# Patient Record
Sex: Female | Born: 1995
Health system: Southern US, Community
[De-identification: ages and names within clinical notes are randomized; demographics above are authoritative.]

## PROBLEM LIST (undated history)

## (undated) DIAGNOSIS — Z789 Other specified health status: Secondary | ICD-10-CM

## (undated) DIAGNOSIS — M419 Scoliosis, unspecified: Secondary | ICD-10-CM

## (undated) DIAGNOSIS — K219 Gastro-esophageal reflux disease without esophagitis: Secondary | ICD-10-CM

## (undated) HISTORY — PX: NO PAST SURGERIES: SHX2092

---
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
## 2017-01-17 ENCOUNTER — Encounter (HOSPITAL_COMMUNITY): Payer: Self-pay

## 2017-01-17 ENCOUNTER — Emergency Department (HOSPITAL_COMMUNITY)
Admission: EM | Admit: 2017-01-17 | Discharge: 2017-01-17 | Disposition: A | Discharge status: Home / Self Care | Payer: BC Managed Care – PPO | Attending: Emergency Medicine | Admitting: Emergency Medicine

## 2017-01-17 DIAGNOSIS — R112 Nausea with vomiting, unspecified: Secondary | ICD-10-CM | POA: Diagnosis not present

## 2017-01-17 DIAGNOSIS — R197 Diarrhea, unspecified: Secondary | ICD-10-CM | POA: Insufficient documentation

## 2017-01-17 DIAGNOSIS — F17210 Nicotine dependence, cigarettes, uncomplicated: Secondary | ICD-10-CM | POA: Diagnosis not present

## 2017-01-17 LAB — CBC
HCT: 36.2 % (ref 36.0–46.0)
Hemoglobin: 11.8 g/dL — ABNORMAL LOW (ref 12.0–15.0)
MCH: 26.8 pg (ref 26.0–34.0)
MCHC: 32.6 g/dL (ref 30.0–36.0)
MCV: 82.3 fL (ref 78.0–100.0)
Platelets: 232 10*3/uL (ref 150–400)
RBC: 4.4 MIL/uL (ref 3.87–5.11)
RDW: 15.9 % — ABNORMAL HIGH (ref 11.5–15.5)
WBC: 11.4 10*3/uL — ABNORMAL HIGH (ref 4.0–10.5)

## 2017-01-17 LAB — URINALYSIS, ROUTINE W REFLEX MICROSCOPIC
Bacteria, UA: NONE SEEN
Bilirubin Urine: NEGATIVE
GLUCOSE, UA: NEGATIVE mg/dL
Ketones, ur: 20 mg/dL — AB
Leukocytes, UA: NEGATIVE
NITRITE: NEGATIVE
PH: 8 (ref 5.0–8.0)
Protein, ur: NEGATIVE mg/dL
Specific Gravity, Urine: 1.02 (ref 1.005–1.030)

## 2017-01-17 LAB — COMPREHENSIVE METABOLIC PANEL
ALK PHOS: 80 U/L (ref 38–126)
ALT: 13 U/L — ABNORMAL LOW (ref 14–54)
ANION GAP: 9 (ref 5–15)
AST: 18 U/L (ref 15–41)
Albumin: 4.4 g/dL (ref 3.5–5.0)
BILIRUBIN TOTAL: 0.7 mg/dL (ref 0.3–1.2)
BUN: 8 mg/dL (ref 6–20)
CALCIUM: 9.4 mg/dL (ref 8.9–10.3)
CO2: 19 mmol/L — ABNORMAL LOW (ref 22–32)
Chloride: 109 mmol/L (ref 101–111)
Creatinine, Ser: 0.68 mg/dL (ref 0.44–1.00)
GFR calc non Af Amer: >60 mL/min (ref >60)
GLUCOSE: 127 mg/dL — AB (ref 65–99)
Potassium: 3.5 mmol/L (ref 3.5–5.1)
Sodium: 137 mmol/L (ref 135–145)
TOTAL PROTEIN: 7.2 g/dL (ref 6.5–8.1)

## 2017-01-17 LAB — LIPASE, BLOOD: Lipase: 17 U/L (ref 11–51)

## 2017-01-17 LAB — I-STAT BETA HCG BLOOD, ED (MC, WL, AP ONLY): I-stat hCG, quantitative: <5 m[IU]/mL (ref <5)

## 2017-01-17 MED ORDER — ONDANSETRON HCL 4 MG/2ML IJ SOLN
4.0000 mg | Freq: Once | INTRAMUSCULAR | Status: AC
Start: 2017-01-17 — End: 2017-01-17
  Administered 2017-01-17: 4 mg via INTRAVENOUS
  Filled 2017-01-17: qty 2

## 2017-01-17 MED ORDER — SODIUM CHLORIDE 0.9 % IV BOLUS (SEPSIS)
1000.0000 mL | Freq: Once | INTRAVENOUS | Status: AC
  Administered 2017-01-17: 1000 mL via INTRAVENOUS

## 2017-01-17 MED ORDER — ONDANSETRON 4 MG PO TBDP
4.0000 mg | ORAL_TABLET | Freq: Once | ORAL | Status: AC | PRN
  Administered 2017-01-17: 4 mg via ORAL

## 2017-01-17 MED ORDER — ONDANSETRON 4 MG PO TBDP
ORAL_TABLET | ORAL | Status: AC
  Filled 2017-01-17: qty 1

## 2017-01-17 MED ORDER — ONDANSETRON 4 MG PO TBDP: 4.0000 mg | ORAL_TABLET | Freq: Three times a day (TID) | ORAL | 0 refills | Status: DC | PRN

## 2017-01-17 NOTE — ED Provider Notes (Signed)
[facility]-EMERGENCY DEPT Provider Note   CSN: [id number] Arrival date & time: 01/17/17  1348     History   Chief Complaint Chief Complaint  Patient presents with  . Emesis  . Diarrhea  . Abdominal Pain    HPI Jodi Rogers is a 21 y.o. female who presents today with complaint of nausea and vomiting, with associated diarrhea. Her symptoms began when she awoke at around 4am this morning to find she had started her period, and states she has vomited around 10 times today. She has been unable to keep food down and endorses 2/10 crampy suprapubic pain which she attributes to her period. At this point, emesis is bilious with no blood. She endorses chills, dizziness, and some episodes of loose stool today.   Denies fever, CP/SOB, Ha, dysuria, hematuria, vaginal itching/discharge, hematochezia.   The history is provided by the patient.    Past Medical History:  Diagnosis Date  . Medical history non-contributory   . Scoliosis     Patient Active Problem List   Diagnosis Date Noted  . Hematemesis with nausea   . Intractable nausea and vomiting 08/11/2016    Past Surgical History:  Procedure Laterality Date  . ESOPHAGOGASTRODUODENOSCOPY N/A 08/13/2016   Procedure: ESOPHAGOGASTRODUODENOSCOPY (EGD);  Surgeon: Scot Jun, MD;  Location: [facility] ENDOSCOPY;  Service: Endoscopy;  Laterality: N/A;  . NO PAST SURGERIES      OB History    No data available       Home Medications    Prior to Admission medications   Medication Sig Start Date End Date Taking? Authorizing Provider  famotidine (PEPCID) 20 MG tablet Take 1 tablet (20 mg total) by mouth 2 (two) times daily. 08/13/16   Katha Hamming, MD  ondansetron (ZOFRAN ODT) 4 MG disintegrating tablet Take 1 tablet (4 mg total) by mouth every 8 (eight) hours as needed for nausea or vomiting. 08/07/16   Darci Current, MD  sucralfate (CARAFATE) 1 g tablet Take 1 tablet (1 g total) by mouth 4 (four) times daily -  with meals and at  bedtime. 08/13/16   Katha Hamming, MD    Family History Family History  Problem Relation Age of Onset  . Diabetes Neg Hx     Social History Social History  Substance Use Topics  . Smoking status: Current Every Day Smoker    Packs/day: 0.50    Types: Cigarettes  . Smokeless tobacco: Never Used  . Alcohol use No     Allergies   Patient has no known allergies.   Review of Systems Review of Systems  Constitutional: Positive for chills. Negative for fever.  HENT: Negative for congestion and sore throat.   Eyes: Negative for discharge and itching.  Respiratory: Negative for shortness of breath.   Cardiovascular: Negative for chest pain.  Gastrointestinal: Positive for abdominal pain, diarrhea, nausea and vomiting. Negative for abdominal distention and blood in stool.  Endocrine: Positive for cold intolerance.  Genitourinary: Negative for dysuria, hematuria and pelvic pain.  Musculoskeletal: Negative for back pain.  Neurological: Positive for dizziness. Negative for syncope and headaches.  Hematological: Negative for adenopathy.     Physical Exam Updated Vital Signs BP 121/84   Pulse 78   Temp 98.7 F (37.1 C) (Oral)   Resp 16   Ht 5\' 10"  (1.778 m)   Wt 68 kg   LMP 01/16/2017   SpO2 98%   BMI 21.52 kg/m   Physical Exam  Constitutional: She is oriented to person, place, and time.  She appears well-developed and well-nourished. No distress.  HENT:  Head: Normocephalic and atraumatic.  Eyes: Conjunctivae are normal. Right eye exhibits no discharge. Left eye exhibits no discharge. No scleral icterus.  Neck: Neck supple. No JVD present. No tracheal deviation present.  Cardiovascular: Normal rate, regular rhythm, normal heart sounds and intact distal pulses.   Pulmonary/Chest: Effort normal and breath sounds normal.  Abdominal: Soft. Bowel sounds are normal. She exhibits no distension and no mass. There is no tenderness. There is no rebound and no guarding.    Negative murphy's, rovsing's, no TTP at McBurney's point.  Musculoskeletal: Normal range of motion.  Lymphadenopathy:    She has no cervical adenopathy.  Neurological: She is alert and oriented to person, place, and time.  Skin: Skin is warm and dry. Capillary refill takes 2 to 3 seconds. She is not diaphoretic. There is pallor.  No skin tenting  Psychiatric: She has a normal mood and affect. Her behavior is normal. Judgment and thought content normal.     ED Treatments / Results  Labs (all labs ordered are listed, but only abnormal results are displayed) Labs Reviewed  COMPREHENSIVE METABOLIC PANEL - Abnormal; Notable for the following:       Result Value   CO2 19 (*)    Glucose, Bld 127 (*)    ALT 13 (*)    All other components within normal limits  CBC - Abnormal; Notable for the following:    WBC 11.4 (*)    Hemoglobin 11.8 (*)    RDW 15.9 (*)    All other components within normal limits  URINALYSIS, ROUTINE W REFLEX MICROSCOPIC - Abnormal; Notable for the following:    Hgb urine dipstick SMALL (*)    Ketones, ur 20 (*)    Squamous Epithelial / LPF 0-5 (*)    All other components within normal limits  LIPASE, BLOOD  I-STAT BETA HCG BLOOD, ED ([facility], [facility], [facility] ONLY)    EKG  EKG Interpretation None       Radiology No results found.  Procedures Procedures (including critical care time)  Medications Ordered in ED Medications  ondansetron (ZOFRAN-ODT) 4 MG disintegrating tablet (not administered)  sodium chloride 0.9 % bolus 1,000 mL (not administered)  ondansetron (ZOFRAN) injection 4 mg (not administered)  ondansetron (ZOFRAN-ODT) disintegrating tablet 4 mg (4 mg Oral Given 01/17/17 1417)     Initial Impression / Assessment and Plan / ED Course  I have reviewed the triage vital signs and the nursing notes.  Pertinent labs & imaging results that were available during my care of the patient were reviewed by me and considered in my medical decision making (see  chart for details).     12yof presents to ED with chief complaint of N/V/D since 4am today. Afebrile, VSS. Labwork shows mild nonspecific WBC elevation and decreased bicarb, consistent with persistent vomiting. Do not suspect appendicitis or acute abdomen due to vitals and unimpressive abdominal exam with no distention, tenderness, guarding or rebound. Negative pregnancy test so do not suspect ectopic pregnancy, unlikely ovarian torsion/cyst and symptoms not consistent with STI/PID. Pt given 1L NS bolus as well as IV zofran, followed by PO challenge, which she tolerated well. Symptomatology possibly related to viral enteritis; advised pt to hydrate and restrict diet to tolerable foods. Will send home with PO antiemetic and advised to return to ED if any concerning symptoms such as fever, severe abdominal pain, or heavy bleeding develop. Pt will f/u with PCP in 1 week for re-evaluation.  Final Clinical Impressions(s) / ED Diagnoses   Final diagnoses:  None    New Prescriptions New Prescriptions   No medications on file     Jeanie SewerMina A Sumi Lye, [place]PA 01/17/17 2115    Jacalyn LefevreJulie Haviland, MD 01/17/17 2140

## 2017-01-17 NOTE — ED Triage Notes (Signed)
[facility].  Onset last night cramping, started period. Woke up 4am vomiting x 7, last vomit at doctors office.  Diarrhea x 2-3, loose.  Last urinated at pediatric office, mouth moist.

## 2017-01-19 ENCOUNTER — Encounter (HOSPITAL_COMMUNITY): Payer: Self-pay | Admitting: *Deleted

## 2017-01-19 ENCOUNTER — Inpatient Hospital Stay (HOSPITAL_COMMUNITY)
Admission: AD | Admit: 2017-01-19 | Discharge: 2017-01-19 | Disposition: A | Discharge status: Home / Self Care | Payer: BC Managed Care – PPO | Source: Ambulatory Visit | Attending: Obstetrics and Gynecology | Admitting: Obstetrics and Gynecology

## 2017-01-19 DIAGNOSIS — E860 Dehydration: Secondary | ICD-10-CM | POA: Diagnosis not present

## 2017-01-19 DIAGNOSIS — F17210 Nicotine dependence, cigarettes, uncomplicated: Secondary | ICD-10-CM | POA: Insufficient documentation

## 2017-01-19 DIAGNOSIS — E876 Hypokalemia: Secondary | ICD-10-CM | POA: Insufficient documentation

## 2017-01-19 DIAGNOSIS — R1110 Vomiting, unspecified: Secondary | ICD-10-CM | POA: Diagnosis present

## 2017-01-19 DIAGNOSIS — A084 Viral intestinal infection, unspecified: Secondary | ICD-10-CM | POA: Insufficient documentation

## 2017-01-19 DIAGNOSIS — Z3202 Encounter for pregnancy test, result negative: Secondary | ICD-10-CM | POA: Insufficient documentation

## 2017-01-19 LAB — URINALYSIS, ROUTINE W REFLEX MICROSCOPIC
Bilirubin Urine: NEGATIVE
Glucose, UA: NEGATIVE mg/dL
Ketones, ur: 80 mg/dL — AB
LEUKOCYTES UA: NEGATIVE
Nitrite: NEGATIVE
PROTEIN: 30 mg/dL — AB
Specific Gravity, Urine: 1.028 (ref 1.005–1.030)
pH: 8 (ref 5.0–8.0)

## 2017-01-19 LAB — CBC
HEMATOCRIT: 34.7 % — AB (ref 36.0–46.0)
Hemoglobin: 11.6 g/dL — ABNORMAL LOW (ref 12.0–15.0)
MCH: 27.3 pg (ref 26.0–34.0)
MCHC: 33.4 g/dL (ref 30.0–36.0)
MCV: 81.6 fL (ref 78.0–100.0)
PLATELETS: 218 10*3/uL (ref 150–400)
RBC: 4.25 MIL/uL (ref 3.87–5.11)
RDW: 16.4 % — AB (ref 11.5–15.5)
WBC: 8.7 10*3/uL (ref 4.0–10.5)

## 2017-01-19 LAB — COMPREHENSIVE METABOLIC PANEL
ALBUMIN: 4.4 g/dL (ref 3.5–5.0)
ALT: 14 U/L (ref 14–54)
ANION GAP: 7 (ref 5–15)
AST: 18 U/L (ref 15–41)
Alkaline Phosphatase: 78 U/L (ref 38–126)
BILIRUBIN TOTAL: 0.7 mg/dL (ref 0.3–1.2)
BUN: 19 mg/dL (ref 6–20)
CHLORIDE: 108 mmol/L (ref 101–111)
CO2: 22 mmol/L (ref 22–32)
Calcium: 8.8 mg/dL — ABNORMAL LOW (ref 8.9–10.3)
Creatinine, Ser: 0.69 mg/dL (ref 0.44–1.00)
GFR calc Af Amer: >60 mL/min (ref >60)
GFR calc non Af Amer: >60 mL/min (ref >60)
GLUCOSE: 110 mg/dL — AB (ref 65–99)
POTASSIUM: 2.8 mmol/L — AB (ref 3.5–5.1)
SODIUM: 137 mmol/L (ref 135–145)
TOTAL PROTEIN: 7.5 g/dL (ref 6.5–8.1)

## 2017-01-19 LAB — POCT PREGNANCY, URINE: PREG TEST UR: NEGATIVE

## 2017-01-19 LAB — LIPASE, BLOOD: Lipase: 12 U/L (ref 11–51)

## 2017-01-19 MED ORDER — LACTATED RINGERS IV BOLUS (SEPSIS)
1000.0000 mL | Freq: Once | INTRAVENOUS | Status: AC
Start: 2017-01-19 — End: 2017-01-19
  Administered 2017-01-19: 1000 mL via INTRAVENOUS

## 2017-01-19 MED ORDER — PROMETHAZINE HCL 25 MG RE SUPP: 25.0000 mg | Freq: Four times a day (QID) | RECTAL | 0 refills | Status: DC | PRN

## 2017-01-19 MED ORDER — PROMETHAZINE HCL 25 MG PO TABS: 25.0000 mg | ORAL_TABLET | Freq: Four times a day (QID) | ORAL | 0 refills | Status: DC | PRN

## 2017-01-19 MED ORDER — POTASSIUM CHLORIDE 20 MEQ PO PACK: 40.0000 meq | PACK | Freq: Every day | ORAL | 0 refills | Status: DC

## 2017-01-19 MED ORDER — PROMETHAZINE HCL 25 MG/ML IJ SOLN
25.0000 mg | Freq: Once | INTRAMUSCULAR | Status: AC
  Administered 2017-01-19: 25 mg via INTRAVENOUS
  Filled 2017-01-19: qty 1

## 2017-01-19 NOTE — MAU Provider Note (Signed)
History     CSN: [id number]  Arrival date and time: 01/19/17 1059   First Provider Initiated Contact with Patient 01/19/17 1125      Chief Complaint  Patient presents with  . Emesis  . Abdominal Pain   Non-pregnant female here with vomiting since this am. She reports 5 episodes of vomiting since 0600. No diarrhea. No sick contacts. Reports tolerating some water today. She used Marijuana last night and states that helped. She was seen in [facility] 2 days ago for similar sx and sent home with Zofran which she took this morning but had no relief. She reports feeling somewhat better yesterday but then became nauseated last night after eating a heavy meal of pasta and alfredo sauce. She reports some allover abdominal discomfort. LMP 01/16/17.     Past Medical History:  Diagnosis Date  . Medical history non-contributory   . Scoliosis     Past Surgical History:  Procedure Laterality Date  . ESOPHAGOGASTRODUODENOSCOPY N/A 08/13/2016   Procedure: ESOPHAGOGASTRODUODENOSCOPY (EGD);  Surgeon: Scot Jun, MD;  Location: [facility] ENDOSCOPY;  Service: Endoscopy;  Laterality: N/A;  . NO PAST SURGERIES      Family History  Problem Relation Age of Onset  . Diabetes Neg Hx     Social History  Substance Use Topics  . Smoking status: Current Every Day Smoker    Packs/day: 0.50    Types: Cigarettes  . Smokeless tobacco: Never Used  . Alcohol use No    Allergies: No Known Allergies  Prescriptions Prior to Admission  Medication Sig Dispense Refill Last Dose  . ondansetron (ZOFRAN ODT) 4 MG disintegrating tablet Take 1 tablet (4 mg total) by mouth every 8 (eight) hours as needed for nausea or vomiting. 9 tablet 0 01/19/2017 at 0830  . famotidine (PEPCID) 20 MG tablet Take 1 tablet (20 mg total) by mouth 2 (two) times daily. (Patient not taking: Reported on 01/19/2017) 60 tablet 0 Not Taking at Unknown time    Review of Systems  Constitutional: Negative for fever.  Gastrointestinal: Positive for  abdominal pain (discomfort).   Physical Exam   Blood pressure 127/80, pulse (!) 52, temperature 98.2 F (36.8 C), temperature source Oral, resp. rate 18, weight 68.9 kg (152 lb), last menstrual period 01/16/2017.  Physical Exam  Nursing note and vitals reviewed. Constitutional: She is oriented to person, place, and time. She appears well-developed and well-nourished. She has a sickly appearance. No distress.  HENT:  Head: Normocephalic and atraumatic.  Neck: Normal range of motion. Neck supple.  Respiratory: Effort normal.  GI: Soft. She exhibits no distension and no mass. There is no tenderness. There is no rebound and no guarding.  Musculoskeletal: Normal range of motion.  Neurological: She is alert and oriented to person, place, and time.  Skin: Skin is warm and dry.  Psychiatric: She has a normal mood and affect.   Results for orders placed or performed during the hospital encounter of 01/19/17 (from the past 24 hour(s))  Urinalysis, Routine w reflex microscopic     Status: Abnormal   Collection Time: 01/19/17 11:03 AM  Result Value Ref Range   Color, Urine YELLOW YELLOW   APPearance HAZY (A) CLEAR   Specific Gravity, Urine 1.028 1.005 - 1.030   pH 8.0 5.0 - 8.0   Glucose, UA NEGATIVE NEGATIVE mg/dL   Hgb urine dipstick LARGE (A) NEGATIVE   Bilirubin Urine NEGATIVE NEGATIVE   Ketones, ur 80 (A) NEGATIVE mg/dL   Protein, ur 30 (A) NEGATIVE mg/dL  Nitrite NEGATIVE NEGATIVE   Leukocytes, UA NEGATIVE NEGATIVE   RBC / HPF TOO NUMEROUS TO COUNT 0 - 5 RBC/hpf   WBC, UA 0-5 0 - 5 WBC/hpf   Bacteria, UA RARE (A) NONE SEEN   Squamous Epithelial / LPF 0-5 (A) NONE SEEN   Mucous PRESENT   Pregnancy, urine POC     Status: None   Collection Time: 01/19/17 11:38 AM  Result Value Ref Range   Preg Test, Ur NEGATIVE NEGATIVE  CBC     Status: Abnormal   Collection Time: 01/19/17 12:29 PM  Result Value Ref Range   WBC 8.7 4.0 - 10.5 K/uL   RBC 4.25 3.87 - 5.11 MIL/uL   Hemoglobin  11.6 (L) 12.0 - 15.0 g/dL   HCT 16.1 (L) 09.6 - 04.5 %   MCV 81.6 78.0 - 100.0 fL   MCH 27.3 26.0 - 34.0 pg   MCHC 33.4 30.0 - 36.0 g/dL   RDW 40.9 (H) 81.1 - 91.4 %   Platelets 218 150 - 400 K/uL  Comprehensive metabolic panel     Status: Abnormal   Collection Time: 01/19/17 12:29 PM  Result Value Ref Range   Sodium 137 135 - 145 mmol/L   Potassium 2.8 (L) 3.5 - 5.1 mmol/L   Chloride 108 101 - 111 mmol/L   CO2 22 22 - 32 mmol/L   Glucose, Bld 110 (H) 65 - 99 mg/dL   BUN 19 6 - 20 mg/dL   Creatinine, Ser 7.82 0.44 - 1.00 mg/dL   Calcium 8.8 (L) 8.9 - 10.3 mg/dL   Total Protein 7.5 6.5 - 8.1 g/dL   Albumin 4.4 3.5 - 5.0 g/dL   AST 18 15 - 41 U/L   ALT 14 14 - 54 U/L   Alkaline Phosphatase 78 38 - 126 U/L   Total Bilirubin 0.7 0.3 - 1.2 mg/dL   GFR calc non Af Amer >60 >60 mL/min   GFR calc Af Amer >60 >60 mL/min   Anion gap 7 5 - 15  Lipase, blood     Status: None   Collection Time: 01/19/17 12:29 PM  Result Value Ref Range   Lipase 12 11 - 51 U/L    MAU Course  Procedures LR 1 L  Phenergan 25 mg IV  MDM Labs ordered and reviewed. No evidence of acute abdominal process. VSS & AF. Nausea improved. Tolerating po fluid. Will treat Hypokalemia. Stable for discharge home.   Assessment and Plan   1. Viral gastroenteritis   2. Dehydration   3. Hypokalemia    Discharge home Follow up with PCP if sx persist BRAT diet Maintain hydration Rx Phenergan tab and supp Rx K-lor Return to MAU for OBGYN emergencies  Allergies as of 01/19/2017   No Known Allergies     Medication List    STOP taking these medications   famotidine 20 MG tablet Commonly known as:  PEPCID   ondansetron 4 MG disintegrating tablet Commonly known as:  ZOFRAN ODT     TAKE these medications   potassium chloride 20 MEQ packet Commonly known as:  KLOR-CON Take 40 mEq by mouth daily.   promethazine 25 MG tablet Commonly known as:  PHENERGAN Take 1 tablet (25 mg total) by mouth every 6 (six)  hours as needed for nausea or vomiting.   promethazine 25 MG suppository Commonly known as:  PHENERGAN Place 1 suppository (25 mg total) rectally every 6 (six) hours as needed for nausea.      Shawna Orleans  Denyse AmassBhambri, CNM 01/19/2017, 3:15 PM

## 2017-01-19 NOTE — Discharge Instructions (Signed)
Bland Diet A bland diet consists of foods that do not have a lot of fat or fiber. Foods without fat or fiber are easier for the body to digest. They are also less likely to irritate your mouth, throat, stomach, and other parts of your gastrointestinal tract. A bland diet is sometimes called a BRAT diet. What is my plan? Your health care provider or dietitian may recommend specific changes to your diet to prevent and treat your symptoms, such as:  Eating small meals often.  Cooking food until it is soft enough to chew easily.  Chewing your food well.  Drinking fluids slowly.  Not eating foods that are very spicy, sour, or fatty.  Not eating citrus fruits, such as oranges and grapefruit. What do I need to know about this diet?  Eat a variety of foods from the bland diet food list.  Do not follow a bland diet longer than you have to.  Ask your health care provider whether you should take vitamins. What foods can I eat? Grains   Hot cereals, such as cream of wheat. Bread, crackers, or tortillas made from refined white flour. Rice. Vegetables  Canned or cooked vegetables. Mashed or boiled potatoes. Fruits  Bananas. Applesauce. Other types of cooked or canned fruit with the skin and seeds removed, such as canned peaches or pears. Meats and Other Protein Sources  Scrambled eggs. Creamy peanut butter or other nut butters. Lean, well-cooked meats, such as chicken or fish. Tofu. Soups or broths. Dairy  Low-fat dairy products, such as milk, cottage cheese, or yogurt. Beverages  Water. Herbal tea. Apple juice. Sweets and Desserts  Pudding. Custard. Fruit gelatin. Ice cream. Fats and Oils  Mild salad dressings. Canola or olive oil. The items listed above may not be a complete list of allowed foods or beverages. Contact your dietitian for more options.  What foods are not recommended? Foods and ingredients that are often not recommended include:  Spicy foods, such as hot sauce or  salsa.  Fried foods.  Sour foods, such as pickled or fermented foods.  Raw vegetables or fruits, especially citrus or berries.  Caffeinated drinks.  Alcohol.  Strongly flavored seasonings or condiments. The items listed above may not be a complete list of foods and beverages that are not allowed. Contact your dietitian for more information.  This information is not intended to replace advice given to you by your health care provider. Make sure you discuss any questions you have with your health care provider. Document Released: 02/22/2016 Document Revised: 04/07/2016 Document Reviewed: 11/12/2014 Elsevier Interactive Patient Education  2017 [employer] Inc. Viral Gastroenteritis, Adult Viral gastroenteritis is also known as the stomach flu. This condition is caused by certain germs (viruses). These germs can be passed from person to person very easily (are very contagious). This condition can cause sudden watery poop (diarrhea), fever, and throwing up (vomiting). Having watery poop and throwing up can make you feel weak and cause you to get dehydrated. Dehydration can make you tired and thirsty, make you have a dry mouth, and make it so you pee (urinate) less often. Older adults and people with other diseases or a weak defense system (immune system) are at higher risk for dehydration. It is important to replace the fluids that you lose from having watery poop and throwing up. Follow these instructions at home: Follow instructions from your doctor about how to care for yourself at home. Eating and drinking   Follow these instructions as told by your doctor:  Take an oral rehydration solution (ORS). This is a drink that is sold at pharmacies and stores.  Drink clear fluids in small amounts as you are able, such as:  Water.  Ice chips.  Diluted fruit juice.  Low-calorie sports drinks.  Eat bland, easy-to-digest foods in small amounts as you are able, such  as:  Bananas.  Applesauce.  Rice.  Low-fat (lean) meats.  Toast.  Crackers.  Avoid fluids that have a lot of sugar or caffeine in them.  Avoid alcohol.  Avoid spicy or fatty foods. General instructions   Drink enough fluid to keep your pee (urine) clear or pale yellow.  Wash your hands often. If you cannot use soap and water, use hand sanitizer.  Make sure that all people in your home wash their hands well and often.  Rest at home while you get better.  Take over-the-counter and prescription medicines only as told by your doctor.  Watch your condition for any changes.  Take a warm bath to help with any burning or pain from having watery poop.  Keep all follow-up visits as told by your doctor. This is important. Contact a doctor if:  You cannot keep fluids down.  Your symptoms get worse.  You have new symptoms.  You feel light-headed or dizzy.  You have muscle cramps. Get help right away if:  You have chest pain.  You feel very weak or you pass out (faint).  You see blood in your throw-up.  Your throw-up looks like coffee grounds.  You have bloody or black poop (stools) or poop that look like tar.  You have a very bad headache, a stiff neck, or both.  You have a rash.  You have very bad pain, cramping, or bloating in your belly (abdomen).  You have trouble breathing.  You are breathing very quickly.  Your heart is beating very quickly.  Your skin feels cold and clammy.  You feel confused.  You have pain when you pee.  You have signs of dehydration, such as:  Dark pee, hardly any pee, or no pee.  Cracked lips.  Dry mouth.  Sunken eyes.  Sleepiness.  Weakness. This information is not intended to replace advice given to you by your health care provider. Make sure you discuss any questions you have with your health care provider. Document Released: 04/18/2008 Document Revised: 05/20/2016 Document Reviewed: 07/07/2015 Elsevier  Interactive Patient Education  2017 [employer].

## 2017-01-19 NOTE — MAU Note (Signed)
Pt was seen in [facility] 2 days ago for vomiting & diarrhea, received IV fluids.  Was feeling better yesterday, was able to keep down food.  Woke up nauseated this morning, has been vomiting ever since, BM's are more solid today.  Lower abd pain also this morning.

## 2017-01-19 NOTE — MAU Provider Note (Signed)
History     CSN: [id number]  Arrival date and time: 01/19/17 1059   First Provider Initiated Contact with Patient 01/19/17 1125      Chief Complaint  Patient presents with  . Emesis  . Abdominal Pain   HPI 21 y/o female with a past medical hx of gastritis presents today to MAU with vomiting and abdominal pain. The abdominal pain is mild and left sided She stated the vomiting started 2 days ago and associates with onset of period. She originally went to her pediatrician where she was told to go to the Er. She was diagnosed with a viral gastroenteritis and received fluids and zofran at the ER felt better and was d/c home with SL zofran. She reports feeling well yesterday and reported eating a large meal yesterday evening. She woke up this morning at 6am not feeling well and reports 5 episodes of vomiting and continued abd pain. Tried zofran with no relief.  Reports diarrhea over the past two days but had a normal BM last night. Does smoke tobacco and mariajuana. Last smoked mariajuana last night and states no association with the vomiting. States she was recently checked for STI.    OB History    No data available      Past Medical History:  Diagnosis Date  . Medical history non-contributory   . Scoliosis     Past Surgical History:  Procedure Laterality Date  . ESOPHAGOGASTRODUODENOSCOPY N/A 08/13/2016   Procedure: ESOPHAGOGASTRODUODENOSCOPY (EGD);  Surgeon: Scot Jun, MD;  Location: [facility] ENDOSCOPY;  Service: Endoscopy;  Laterality: N/A;  . NO PAST SURGERIES      Family History  Problem Relation Age of Onset  . Diabetes Neg Hx     Social History  Substance Use Topics  . Smoking status: Current Every Day Smoker    Packs/day: 0.50    Types: Cigarettes  . Smokeless tobacco: Never Used  . Alcohol use No    Allergies: No Known Allergies  Prescriptions Prior to Admission  Medication Sig Dispense Refill Last Dose  . ondansetron (ZOFRAN ODT) 4 MG disintegrating tablet  Take 1 tablet (4 mg total) by mouth every 8 (eight) hours as needed for nausea or vomiting. 9 tablet 0 01/19/2017 at 0830  . famotidine (PEPCID) 20 MG tablet Take 1 tablet (20 mg total) by mouth 2 (two) times daily. (Patient not taking: Reported on 01/19/2017) 60 tablet 0 Not Taking at Unknown time    Review of Systems  Constitutional: Positive for appetite change, chills and fatigue.  HENT: Positive for congestion.   Respiratory: Negative for chest tightness and shortness of breath.   Cardiovascular: Negative for chest pain.  Gastrointestinal: Positive for abdominal pain, blood in stool, diarrhea, nausea and vomiting. Negative for abdominal distention and constipation.  Genitourinary: Negative for difficulty urinating, flank pain and urgency.  Musculoskeletal: Positive for arthralgias.  Neurological: Negative for dizziness.   Physical Exam   Blood pressure 127/80, pulse (!) 52, temperature 97.5 F (36.4 C), temperature source Oral, resp. rate 18, weight 68.9 kg (152 lb), last menstrual period 01/16/2017.  Physical Exam  Constitutional: She is oriented to person, place, and time. She appears well-developed and well-nourished. She appears distressed.  HENT:  Head: Normocephalic and atraumatic.  Neck: Neck supple.  Cardiovascular: Normal rate, regular rhythm and normal heart sounds.  Exam reveals no gallop and no friction rub.   No murmur heard. Respiratory: Effort normal and breath sounds normal.  GI: Soft. Bowel sounds are normal. She exhibits no  distension and no mass. There is tenderness (LUQ). There is no rebound and no guarding.  Neurological: She is alert and oriented to person, place, and time.  Skin: Skin is warm and dry.  Psychiatric: She has a normal mood and affect.    MAU Course  Procedures  MDM Pt was seen at ED on 3/6 and diagnosed with viral gastroenteritis. Her labs showed mildly elevated WBCs and decreased bicarb consistent with vomiting. She responded well to fluids  and antiemetics. Urine was normal. Will give Phenergan IV and fluids. Re-check CBC and CMP. CBC WNL. BMP shows Hypokalemia. Will replete. Tolerated oral intake.   Assessment and Plan   1. Vomiting- Phenergan Q4 PRN may use rectal or suppository. BRAT diet.  2. Hypokalemia- Oral Kcl 7 days   Seek Emergency care if severe vomiting persists despite treatment or if you develop chest pain or heart racing.  Melburn PopperJames Avrohom Mckelvin 01/19/2017, 11:46 AM

## 2017-05-13 ENCOUNTER — Emergency Department
Admission: EM | Admit: 2017-05-13 | Discharge: 2017-05-13 | Discharge status: Against Medical Advice | Payer: BC Managed Care – PPO | Attending: Emergency Medicine | Admitting: Emergency Medicine

## 2017-05-13 ENCOUNTER — Encounter: Payer: Self-pay | Admitting: Emergency Medicine

## 2017-05-13 DIAGNOSIS — R102 Pelvic and perineal pain: Secondary | ICD-10-CM | POA: Diagnosis present

## 2017-05-13 DIAGNOSIS — Z5321 Procedure and treatment not carried out due to patient leaving prior to being seen by health care provider: Secondary | ICD-10-CM | POA: Diagnosis not present

## 2017-05-13 LAB — COMPREHENSIVE METABOLIC PANEL
ALT: 13 U/L — AB (ref 14–54)
AST: 21 U/L (ref 15–41)
Albumin: 5 g/dL (ref 3.5–5.0)
Alkaline Phosphatase: 85 U/L (ref 38–126)
Anion gap: 6 (ref 5–15)
BILIRUBIN TOTAL: 0.8 mg/dL (ref 0.3–1.2)
BUN: 14 mg/dL (ref 6–20)
CO2: 24 mmol/L (ref 22–32)
CREATININE: 0.69 mg/dL (ref 0.44–1.00)
Calcium: 9.7 mg/dL (ref 8.9–10.3)
Chloride: 107 mmol/L (ref 101–111)
GFR calc Af Amer: >60 mL/min (ref >60)
Glucose, Bld: 114 mg/dL — ABNORMAL HIGH (ref 65–99)
Potassium: 3.4 mmol/L — ABNORMAL LOW (ref 3.5–5.1)
Sodium: 137 mmol/L (ref 135–145)
TOTAL PROTEIN: 8.2 g/dL — AB (ref 6.5–8.1)

## 2017-05-13 LAB — CBC
HCT: 42 % (ref 35.0–47.0)
Hemoglobin: 13.8 g/dL (ref 12.0–16.0)
MCH: 27.8 pg (ref 26.0–34.0)
MCHC: 32.8 g/dL (ref 32.0–36.0)
MCV: 84.7 fL (ref 80.0–100.0)
PLATELETS: 244 10*3/uL (ref 150–440)
RBC: 4.95 MIL/uL (ref 3.80–5.20)
RDW: 17.6 % — AB (ref 11.5–14.5)
WBC: 9 10*3/uL (ref 3.6–11.0)

## 2017-05-13 MED ORDER — SODIUM CHLORIDE 0.9 % IV BOLUS (SEPSIS): 1000.0000 mL | Freq: Once | INTRAVENOUS | Status: DC

## 2017-05-13 MED ORDER — ONDANSETRON 4 MG PO TBDP
4.0000 mg | ORAL_TABLET | Freq: Once | ORAL | Status: AC | PRN
  Administered 2017-05-13: 4 mg via ORAL
  Filled 2017-05-13: qty 1

## 2017-05-13 NOTE — ED Notes (Signed)
Called back to subwait by visitor; pt wants to leave; spoke with pt, encouraging her to stay, but she refuses; IV pulled; pt leaving via wheelchair; encouraged pt to return for any urgent needs

## 2017-05-13 NOTE — ED Triage Notes (Signed)
Pt states is having pelvic pain, nausea, "sweating". Pt states she started menstrating this am. Pt states "i have really bad periods but this is the worst". Pt with pwd skin.

## 2018-06-21 ENCOUNTER — Emergency Department
Admission: EM | Admit: 2018-06-21 | Discharge: 2018-06-21 | Disposition: A | Discharge status: Home / Self Care | Payer: BC Managed Care – PPO | Attending: Emergency Medicine | Admitting: Emergency Medicine

## 2018-06-21 ENCOUNTER — Emergency Department: Payer: BC Managed Care – PPO

## 2018-06-21 ENCOUNTER — Other Ambulatory Visit: Payer: Self-pay

## 2018-06-21 ENCOUNTER — Encounter: Payer: Self-pay | Admitting: Emergency Medicine

## 2018-06-21 DIAGNOSIS — F1210 Cannabis abuse, uncomplicated: Secondary | ICD-10-CM | POA: Insufficient documentation

## 2018-06-21 DIAGNOSIS — Z79899 Other long term (current) drug therapy: Secondary | ICD-10-CM | POA: Diagnosis not present

## 2018-06-21 DIAGNOSIS — F17210 Nicotine dependence, cigarettes, uncomplicated: Secondary | ICD-10-CM | POA: Insufficient documentation

## 2018-06-21 DIAGNOSIS — M79644 Pain in right finger(s): Secondary | ICD-10-CM | POA: Insufficient documentation

## 2018-06-21 MED ORDER — MELOXICAM 7.5 MG PO TABS: 7.5000 mg | ORAL_TABLET | Freq: Every day | ORAL | 1 refills | Status: AC

## 2018-06-21 NOTE — ED Triage Notes (Signed)
PT to ED via POV with c/o RT hand pain after injury during summer camp. + Swelling noted.

## 2018-06-21 NOTE — ED Provider Notes (Signed)
[facility] Emergency Department Provider Note  ____________________________________________  Time seen: Approximately 7:18 PM  I have reviewed the triage vital signs and the nursing notes.   HISTORY  Chief Complaint Hand Pain    HPI Jodi Rogers is a 22 y.o. female presents to the emergency department with 8 out of 10 aching right hand pain along the distribution of the ulnar collateral ligament of the right thumb.  Patient reports that she was playing tug of war at a camp, her place of work when injury occurred.  Patient reports that her thumb was hyperextended.  Patient is now having difficulty performing flexion at the thumb.  No alleviating measures have been attempted.   Past Medical History:  Diagnosis Date  . Medical history non-contributory   . Scoliosis     Patient Active Problem List   Diagnosis Date Noted  . Hematemesis with nausea   . Intractable nausea and vomiting 08/11/2016    Past Surgical History:  Procedure Laterality Date  . ESOPHAGOGASTRODUODENOSCOPY N/A 08/13/2016   Procedure: ESOPHAGOGASTRODUODENOSCOPY (EGD);  Surgeon: Scot Jun, MD;  Location: [facility] ENDOSCOPY;  Service: Endoscopy;  Laterality: N/A;  . NO PAST SURGERIES      Prior to Admission medications   Medication Sig Start Date End Date Taking? Authorizing Provider  meloxicam (MOBIC) 7.5 MG tablet Take 1 tablet (7.5 mg total) by mouth daily for 7 days. 06/21/18 06/28/18  Orvil Feil, PA-C  potassium chloride (KLOR-CON) 20 MEQ packet Take 40 mEq by mouth daily. 01/19/17   Donette Larry, CNM  promethazine (PHENERGAN) 25 MG suppository Place 1 suppository (25 mg total) rectally every 6 (six) hours as needed for nausea. 01/19/17 01/19/18  Donette Larry, CNM  promethazine (PHENERGAN) 25 MG tablet Take 1 tablet (25 mg total) by mouth every 6 (six) hours as needed for nausea or vomiting. 01/19/17   Donette Larry, CNM    Allergies Hydrocodone  Family History  Problem  Relation Age of Onset  . Diabetes Neg Hx     Social History Social History   Tobacco Use  . Smoking status: Current Every Day Smoker    Packs/day: 0.50    Types: Cigarettes  . Smokeless tobacco: Never Used  Substance Use Topics  . Alcohol use: No  . Drug use: Yes    Types: Marijuana     Review of Systems  Constitutional: No fever/chills Eyes: No visual changes. No discharge ENT: No upper respiratory complaints. Cardiovascular: no chest pain. Respiratory: no cough. No SOB. Gastrointestinal: No abdominal pain.  No nausea, no vomiting.  No diarrhea.  No constipation. Musculoskeletal: Patient has right thumb pain.  Skin: Negative for rash, abrasions, lacerations, ecchymosis. Neurological: Negative for headaches, focal weakness or numbness.  ____________________________________________   PHYSICAL EXAM:  VITAL SIGNS: ED Triage Vitals  Enc Vitals Group     BP 06/21/18 1707 (!) 90/55     Pulse Rate 06/21/18 1707 70     Resp 06/21/18 1707 18     Temp 06/21/18 1707 98.5 F (36.9 C)     Temp Source 06/21/18 1707 Oral     SpO2 06/21/18 1707 99 %     Weight 06/21/18 1709 170 lb (77.1 kg)     Height 06/21/18 1709 5\' 10"  (1.778 m)     Head Circumference --      Peak Flow --      Pain Score 06/21/18 1712 4     Pain Loc --      Pain Edu? --  Excl. in GC? --      Constitutional: Alert and oriented. Well appearing and in no acute distress. Eyes: Conjunctivae are normal. PERRL. EOMI. Head: Atraumatic. Cardiovascular: Normal rate, regular rhythm. Normal S1 and S2.  Good peripheral circulation. Respiratory: Normal respiratory effort without tachypnea or retractions. Lungs CTAB. Good air entry to the bases with no decreased or absent breath sounds. Musculoskeletal: Patient has edema and ecchymosis along the distribution of the right ulnar collateral ligament of the thumb.  Patient is having difficulty performing extension at the thumb.  Palpable radial pulse,  right. Neurologic:  Normal speech and language. No gross focal neurologic deficits are appreciated.  Skin:  Skin is warm, dry and intact. No rash noted. Psychiatric: Mood and affect are normal. Speech and behavior are normal. Patient exhibits appropriate insight and judgement.   ____________________________________________   LABS (all labs ordered are listed, but only abnormal results are displayed)  Labs Reviewed - No data to display ____________________________________________  EKG   ____________________________________________  RADIOLOGY I personally viewed and evaluated these images as part of my medical decision making, as well as reviewing the written report by the radiologist.  Dg Hand Complete Right  Result Date: 06/21/2018 CLINICAL DATA:  Pain following fall EXAM: RIGHT HAND - COMPLETE 3+ VIEW COMPARISON:  None. FINDINGS: Frontal, oblique, and lateral views were obtained. No evident fracture or dislocation. Joint spaces appear normal. No erosive change. There is mild soft tissue swelling. IMPRESSION: No fracture or dislocation. No appreciable arthropathy. Mild soft tissue swelling. Electronically Signed   By: Bretta BangWilliam  Woodruff III M.D.   On: 06/21/2018 17:47    ____________________________________________    PROCEDURES  Procedure(s) performed:    Procedures    Medications - No data to display   ____________________________________________   INITIAL IMPRESSION / ASSESSMENT AND PLAN / ED COURSE  Pertinent labs & imaging results that were available during my care of the patient were reviewed by me and considered in my medical decision making (see chart for details).  Review of the [place] CSRS was performed in accordance of the [facility] prior to dispensing any controlled drugs.      Assessment and plan Right thumb pain Patient presents to the emergency department with right thumb pain after patient was playing tug of war at a local camp.  X-ray examination reveals  no acute abnormality.  Patient has pain, edema and ecchymosis along the distribution of the right thumb ulnar collateral ligament.  Patient was splinted in the emergency department and referred to Dr. Stephenie AcresSoria.  The importance of appropriate follow-up was emphasized during 2 instances of this emergency department encounter.  She was discharged with meloxicam.  Vital signs are reassuring prior to discharge.  All patient questions were answered.      ____________________________________________  FINAL CLINICAL IMPRESSION(S) / ED DIAGNOSES  Final diagnoses:  Pain of right thumb      NEW MEDICATIONS STARTED DURING THIS VISIT:  ED Discharge Orders         Ordered    meloxicam (MOBIC) 7.5 MG tablet  Daily     06/21/18 1856              This chart was dictated using voice recognition software/Dragon. Despite best efforts to proofread, errors can occur which can change the meaning. Any change was purely unintentional.    Gasper LloydWoods, Nefertari Rebman M, PA-C 06/21/18 2042    Phineas SemenGoodman, [place], MD 06/21/18 2255

## 2018-06-21 NOTE — ED Notes (Signed)
See triage note  Presents with pain and swelling to right hand  States she hit it this afternoon at camp  Positive swelling noted

## 2019-02-22 ENCOUNTER — Encounter (HOSPITAL_COMMUNITY): Payer: Self-pay | Admitting: Emergency Medicine

## 2019-02-22 ENCOUNTER — Emergency Department (HOSPITAL_COMMUNITY)
Admission: EM | Admit: 2019-02-22 | Discharge: 2019-02-22 | Disposition: A | Discharge status: Home / Self Care | Payer: BC Managed Care – PPO | Attending: Emergency Medicine | Admitting: Emergency Medicine

## 2019-02-22 ENCOUNTER — Other Ambulatory Visit: Payer: Self-pay

## 2019-02-22 DIAGNOSIS — E876 Hypokalemia: Secondary | ICD-10-CM

## 2019-02-22 DIAGNOSIS — E860 Dehydration: Secondary | ICD-10-CM | POA: Diagnosis not present

## 2019-02-22 DIAGNOSIS — R112 Nausea with vomiting, unspecified: Secondary | ICD-10-CM

## 2019-02-22 DIAGNOSIS — F17210 Nicotine dependence, cigarettes, uncomplicated: Secondary | ICD-10-CM | POA: Diagnosis not present

## 2019-02-22 DIAGNOSIS — R1116 Cannabis hyperemesis syndrome: Secondary | ICD-10-CM

## 2019-02-22 DIAGNOSIS — F12988 Cannabis use, unspecified with other cannabis-induced disorder: Secondary | ICD-10-CM | POA: Diagnosis not present

## 2019-02-22 LAB — LIPASE, BLOOD: Lipase: 23 U/L (ref 11–51)

## 2019-02-22 LAB — COMPREHENSIVE METABOLIC PANEL
ALT: 15 U/L (ref 0–44)
AST: 23 U/L (ref 15–41)
Albumin: 4.6 g/dL (ref 3.5–5.0)
Alkaline Phosphatase: 90 U/L (ref 38–126)
Anion gap: 16 — ABNORMAL HIGH (ref 5–15)
BUN: 18 mg/dL (ref 6–20)
CO2: 16 mmol/L — ABNORMAL LOW (ref 22–32)
Calcium: 9.6 mg/dL (ref 8.9–10.3)
Chloride: 107 mmol/L (ref 98–111)
Creatinine, Ser: 0.93 mg/dL (ref 0.44–1.00)
GFR calc Af Amer: >60 mL/min (ref >60)
GFR calc non Af Amer: >60 mL/min (ref >60)
Glucose, Bld: 120 mg/dL — ABNORMAL HIGH (ref 70–99)
Potassium: 3.2 mmol/L — ABNORMAL LOW (ref 3.5–5.1)
Sodium: 139 mmol/L (ref 135–145)
Total Bilirubin: 0.7 mg/dL (ref 0.3–1.2)
Total Protein: 7.4 g/dL (ref 6.5–8.1)

## 2019-02-22 LAB — URINALYSIS, ROUTINE W REFLEX MICROSCOPIC
Bilirubin Urine: NEGATIVE
Glucose, UA: NEGATIVE mg/dL
Ketones, ur: 80 mg/dL — AB
Leukocytes,Ua: NEGATIVE
Nitrite: NEGATIVE
Protein, ur: 30 mg/dL — AB
Specific Gravity, Urine: 1.03 (ref 1.005–1.030)
pH: 6 (ref 5.0–8.0)

## 2019-02-22 LAB — CBC WITH DIFFERENTIAL/PLATELET
Abs Immature Granulocytes: 0.06 10*3/uL (ref 0.00–0.07)
Basophils Absolute: 0 10*3/uL (ref 0.0–0.1)
Basophils Relative: 0 %
Eosinophils Absolute: 0 10*3/uL (ref 0.0–0.5)
Eosinophils Relative: 0 %
HCT: 39.4 % (ref 36.0–46.0)
Hemoglobin: 13.1 g/dL (ref 12.0–15.0)
Immature Granulocytes: 1 %
Lymphocytes Relative: 9 %
Lymphs Abs: 1.1 10*3/uL (ref 0.7–4.0)
MCH: 28.8 pg (ref 26.0–34.0)
MCHC: 33.2 g/dL (ref 30.0–36.0)
MCV: 86.6 fL (ref 80.0–100.0)
Monocytes Absolute: 0.8 10*3/uL (ref 0.1–1.0)
Monocytes Relative: 6 %
Neutro Abs: 10.7 10*3/uL — ABNORMAL HIGH (ref 1.7–7.7)
Neutrophils Relative %: 84 %
Platelets: 248 10*3/uL (ref 150–400)
RBC: 4.55 MIL/uL (ref 3.87–5.11)
RDW: 15 % (ref 11.5–15.5)
WBC: 12.7 10*3/uL — ABNORMAL HIGH (ref 4.0–10.5)
nRBC: 0 % (ref 0.0–0.2)

## 2019-02-22 LAB — I-STAT BETA HCG BLOOD, ED (MC, WL, AP ONLY): I-stat hCG, quantitative: <5 m[IU]/mL (ref <5)

## 2019-02-22 MED ORDER — SODIUM CHLORIDE 0.9 % IV BOLUS: 500.0000 mL | Freq: Once | INTRAVENOUS | Status: DC

## 2019-02-22 MED ORDER — POTASSIUM CHLORIDE 10 MEQ/100ML IV SOLN: 10.0000 meq | Freq: Once | INTRAVENOUS | Status: DC

## 2019-02-22 MED ORDER — METOCLOPRAMIDE HCL 10 MG PO TABS: 10.0000 mg | ORAL_TABLET | Freq: Four times a day (QID) | ORAL | 0 refills | Status: DC

## 2019-02-22 MED ORDER — POTASSIUM CHLORIDE ER 10 MEQ PO TBCR: 20.0000 meq | EXTENDED_RELEASE_TABLET | Freq: Every day | ORAL | 0 refills | Status: DC

## 2019-02-22 MED ORDER — SODIUM CHLORIDE 0.9 % IV BOLUS
1000.0000 mL | Freq: Once | INTRAVENOUS | Status: AC
  Administered 2019-02-22: 1000 mL via INTRAVENOUS

## 2019-02-22 MED ORDER — POTASSIUM CHLORIDE CRYS ER 20 MEQ PO TBCR: 40.0000 meq | EXTENDED_RELEASE_TABLET | Freq: Once | ORAL | Status: DC

## 2019-02-22 MED ORDER — METOCLOPRAMIDE HCL 5 MG/ML IJ SOLN
10.0000 mg | Freq: Once | INTRAMUSCULAR | Status: AC
  Administered 2019-02-22: 13:00:00 10 mg via INTRAVENOUS
  Filled 2019-02-22: qty 2

## 2019-02-22 NOTE — ED Notes (Signed)
Pt actively vomiting at this time.

## 2019-02-22 NOTE — ED Triage Notes (Signed)
Pt states she has been vomiting or spitting up since 02/06/2019. She says she had been drinking very heavily 3/25 and has not been the same. States she can drink water but nothing else.

## 2019-02-22 NOTE — ED Provider Notes (Signed)
[facility] Provider Note   CSN: [id number] Arrival date & time: 02/22/19  1230    History   Chief Complaint Chief Complaint  Patient presents with  . Emesis    HPI Jodi Rogers is a 23 y.o. female with a past medical history of marijuana use, who presents to the ED for ongoing nausea, vomiting, generalized abdominal pain since 02/07/2019.  States that she "got very drunk" on her 23st birthday on [birth year].  Since then she has constantly been having nausea, nonbloody, nonbilious emesis anytime she tries to eat anything.  She has been able to keep water and Gatorade down.  She was evaluated at the urgent care earlier in the week and was given IM Phenergan with improvement in her symptoms.  States Nexium did help her several days ago but is no longer effective.  She endorses daily marijuana use and states that "it actually helps feel like I am getting better with it."  She denies any changes to bowel movements, fever, sick contacts with similar symptoms, prior abdominal surgeries.  She had an EGD done in 2017 which showed gastritis/esophagitis.  She denies possibility of pregnancy.     HPI  Past Medical History:  Diagnosis Date  . Medical history non-contributory   . Scoliosis     Patient Active Problem List   Diagnosis Date Noted  . Hematemesis with nausea   . Intractable nausea and vomiting 08/11/2016    Past Surgical History:  Procedure Laterality Date  . ESOPHAGOGASTRODUODENOSCOPY N/A 08/13/2016   Procedure: ESOPHAGOGASTRODUODENOSCOPY (EGD);  Surgeon: Scot Junobert T Elliott, MD;  [facility];  Service: Endoscopy;  Laterality: N/A;  . NO PAST SURGERIES       OB History   No obstetric history on file.      Home Medications    Prior to Admission medications   Medication Sig Start Date End Date Taking? Authorizing Provider  metoCLOPramide (REGLAN) 10 MG tablet Take 1 tablet (10 mg total) by mouth every 6 (six) hours. 02/22/19    Sharni Negron, PA-C  ondansetron (ZOFRAN) 4 MG tablet Take 4 mg by mouth 2 (two) times daily as needed for nausea/vomiting. 02/20/19   [provider]  potassium chloride (K-DUR) 10 MEQ tablet Take 2 tablets (20 mEq total) by mouth daily for 3 days. 02/22/19 02/25/19  Nicklaus Alviar, PA-C  promethazine (PHENERGAN) 25 MG suppository Place 1 suppository (25 mg total) rectally every 6 (six) hours as needed for nausea. 01/19/17 01/19/18  Donette LarryBhambri, Melanie, CNM  promethazine (PHENERGAN) 25 MG tablet Take 1 tablet (25 mg total) by mouth every 6 (six) hours as needed for nausea or vomiting. 01/19/17   Donette LarryBhambri, Melanie, CNM    Family History Family History  Problem Relation Age of Onset  . Diabetes Neg Hx     Social History Social History   Tobacco Use  . Smoking status: Current Every Day Smoker    Packs/day: 0.50    Types: Cigarettes  . Smokeless tobacco: Former NeurosurgeonUser    Quit date: 02/21/2018  . Tobacco comment: pt states she quit  Substance Use Topics  . Alcohol use: Yes    Comment: sometimes  . Drug use: Yes    Types: Marijuana     Allergies   Hydrocodone   Review of Systems Review of Systems  Constitutional: Negative for appetite change, chills and fever.  HENT: Negative for ear pain, rhinorrhea, sneezing and sore throat.   Eyes: Negative for photophobia and visual disturbance.  Respiratory: Negative for cough, chest tightness, shortness of breath and wheezing.   Cardiovascular: Negative for chest pain and palpitations.  Gastrointestinal: Positive for abdominal pain, nausea and vomiting. Negative for blood in stool, constipation and diarrhea.  Genitourinary: Negative for dysuria, hematuria and urgency.  Musculoskeletal: Negative for myalgias.  Skin: Negative for rash.  Neurological: Negative for dizziness, weakness and light-headedness.     Physical Exam Updated Vital Signs BP 124/81 (BP Location: Left Arm)   Pulse 63   Temp 98.8 F (37.1 C) (Oral)   Resp 17   Ht 5'  10" (1.778 m)   Wt 68 kg   SpO2 100%   BMI 21.52 kg/m   Physical Exam Vitals signs and nursing note reviewed.  Constitutional:      General: She is not in acute distress.    Appearance: She is well-developed.  HENT:     Head: Normocephalic and atraumatic.     Nose: Nose normal.  Eyes:     General: No scleral icterus.       Right eye: No discharge.        Left eye: No discharge.     Conjunctiva/sclera: Conjunctivae normal.  Neck:     Musculoskeletal: Normal range of motion and neck supple.  Cardiovascular:     Rate and Rhythm: Normal rate and regular rhythm.     Heart sounds: Normal heart sounds. No murmur. No friction rub. No gallop.   Pulmonary:     Effort: Pulmonary effort is normal. No respiratory distress.     Breath sounds: Normal breath sounds.  Abdominal:     General: Bowel sounds are normal. There is no distension.     Palpations: Abdomen is soft.     Tenderness: There is abdominal tenderness (generalized). There is no guarding.  Musculoskeletal: Normal range of motion.  Skin:    General: Skin is warm and dry.     Findings: No rash.  Neurological:     Mental Status: She is alert.     Motor: No abnormal muscle tone.     Coordination: Coordination normal.      ED Treatments / Results  Labs (all labs ordered are listed, but only abnormal results are displayed) Labs Reviewed  COMPREHENSIVE METABOLIC PANEL - Abnormal; Notable for the following components:      Result Value   Potassium 3.2 (*)    CO2 16 (*)    Glucose, Bld 120 (*)    Anion gap 16 (*)    All other components within normal limits  URINALYSIS, ROUTINE W REFLEX MICROSCOPIC - Abnormal; Notable for the following components:   Hgb urine dipstick MODERATE (*)    Ketones, ur 80 (*)    Protein, ur 30 (*)    Bacteria, UA RARE (*)    All other components within normal limits  LIPASE, BLOOD  CBC WITH DIFFERENTIAL/PLATELET  I-STAT BETA HCG BLOOD, ED ([facility], [facility], [facility] ONLY)    EKG None  Radiology No  results found.  Procedures Procedures (including critical care time)  Medications Ordered in ED Medications  sodium chloride 0.9 % bolus 1,000 mL (1,000 mLs Intravenous New Bag/Given 02/22/19 1311)  metoCLOPramide (REGLAN) injection 10 mg (10 mg Intravenous Given 02/22/19 1309)     Initial Impression / Assessment and Plan / ED Course  I have reviewed the triage vital signs and the nursing notes.  Pertinent labs & imaging results that were available during my care of the patient were reviewed by me and considered in my  medical decision making (see chart for details).        23 year old female presents to ED for ongoing nausea, vomiting since 02/07/2019 when she got intoxicated for her birthday.  She has had some improvement with IM Phenergan and Nexium.  Occasions and no longer effective and she continues to have nausea and vomiting.  An EGD done in 2017 for similar symptoms showed gastritis, esophagitis.  She does endorse daily marijuana use.  She denies possibility of pregnancy.  Patient vomiting on my exam.  Abdomen is generally tender without rebound or guarding or area of focal worsening tenderness.  Plan for lab work, give IV fluids, antiemetics and reassess.  Lab work significant for hypokalemia of 3.2, anion gap of 16, ketones and protein in urine.  Pregnancy test is negative.  Lipase is unremarkable.  Patient was given a liter of IV fluids, Reglan.  She states that her nausea has improved but is requesting discharge home. She states that "I just feel miserable, I'll feel so much better at home in a hot bath." She agrees to take PO potassium and Reglan prn. She does not want to stay for CBC results, so I will contact her for any concerning results. I suspect cannabinoid hyperemesis based on her history and work-up. There is no focal tenderness that would concern me for appendicitis, cholecystitis or other surgical or emergent cause. She knows to return if worse. Educated on discontinuing  her marijuana use.  Patient is hemodynamically stable, in NAD, and able to ambulate in the ED. Evaluation does not show pathology that would require ongoing emergent intervention or inpatient treatment. I explained the diagnosis to the patient. Pain has been managed and has no complaints prior to discharge. Patient is comfortable with above plan and is stable for discharge at this time. All questions were answered prior to disposition. Strict return precautions for returning to the ED were discussed. Encouraged follow up with PCP.   An After Visit Summary was printed and given to the patient.   Portions of this note were generated with Scientist, clinical (histocompatibility and immunogenetics). Dictation errors may occur despite best attempts at proofreading.  Final Clinical Impressions(s) / ED Diagnoses   Final diagnoses:  Cannabinoid hyperemesis syndrome (HCC)  Hypokalemia  Dehydration    ED Discharge Orders         Ordered    metoCLOPramide (REGLAN) 10 MG tablet  Every 6 hours     02/22/19 1339    potassium chloride (K-DUR) 10 MEQ tablet  Daily     02/22/19 1339           Dietrich Pates, PA-C 02/22/19 1344    Pricilla Loveless, MD 02/23/19 [phone]

## 2019-02-22 NOTE — Discharge Instructions (Signed)
Take the Reglan and the potassium as prescribed. Your symptoms could be caused by marijuana use.  You will need to discontinue marijuana use for your symptoms to improve. Follow-up with your primary care provider. Return to the ED for worsening symptoms, severe abdominal pain with fever, blood in your stool, vomiting up blood.

## 2019-02-22 NOTE — ED Notes (Signed)
Pt verbalized understanding of discharge paperwork and follow-up care. E-sig pad not working.

## 2019-02-28 ENCOUNTER — Other Ambulatory Visit (HOSPITAL_COMMUNITY): Payer: Self-pay | Admitting: Internal Medicine

## 2019-02-28 ENCOUNTER — Other Ambulatory Visit: Payer: Self-pay | Admitting: Internal Medicine

## 2019-02-28 DIAGNOSIS — R1012 Left upper quadrant pain: Secondary | ICD-10-CM

## 2019-03-06 ENCOUNTER — Other Ambulatory Visit: Payer: Self-pay

## 2019-03-06 ENCOUNTER — Ambulatory Visit
Admission: RE | Admit: 2019-03-06 | Discharge: 2019-03-06 | Disposition: A | Discharge status: Home / Self Care | Payer: BC Managed Care – PPO | Source: Ambulatory Visit | Attending: Internal Medicine | Admitting: Internal Medicine

## 2019-03-06 DIAGNOSIS — R1012 Left upper quadrant pain: Secondary | ICD-10-CM | POA: Diagnosis present

## 2019-06-17 ENCOUNTER — Emergency Department
Admission: EM | Admit: 2019-06-17 | Discharge: 2019-06-17 | Disposition: A | Discharge status: Home / Self Care | Payer: BC Managed Care – PPO | Attending: Emergency Medicine | Admitting: Emergency Medicine

## 2019-06-17 ENCOUNTER — Encounter: Payer: Self-pay | Admitting: Emergency Medicine

## 2019-06-17 ENCOUNTER — Other Ambulatory Visit: Payer: Self-pay

## 2019-06-17 DIAGNOSIS — T7840XA Allergy, unspecified, initial encounter: Secondary | ICD-10-CM | POA: Insufficient documentation

## 2019-06-17 DIAGNOSIS — R21 Rash and other nonspecific skin eruption: Secondary | ICD-10-CM | POA: Diagnosis present

## 2019-06-17 DIAGNOSIS — F17210 Nicotine dependence, cigarettes, uncomplicated: Secondary | ICD-10-CM | POA: Insufficient documentation

## 2019-06-17 MED ORDER — METHYLPREDNISOLONE 4 MG PO TBPK: ORAL_TABLET | ORAL | 0 refills | Status: DC

## 2019-06-17 MED ORDER — DEXAMETHASONE SODIUM PHOSPHATE 10 MG/ML IJ SOLN
10.0000 mg | Freq: Once | INTRAMUSCULAR | Status: AC
  Administered 2019-06-17: 10 mg via INTRAMUSCULAR
  Filled 2019-06-17: qty 1

## 2019-06-17 MED ORDER — HYDROXYZINE HCL 50 MG PO TABS
50.0000 mg | ORAL_TABLET | Freq: Once | ORAL | Status: AC
  Administered 2019-06-17: 50 mg via ORAL
  Filled 2019-06-17: qty 1

## 2019-06-17 MED ORDER — HYDROXYZINE HCL 50 MG PO TABS: 50.0000 mg | ORAL_TABLET | Freq: Three times a day (TID) | ORAL | 0 refills | Status: DC | PRN

## 2019-06-17 NOTE — ED Triage Notes (Signed)
Pt states started Lamictal 2 weeks ago and started with a rash on her back Sunday and it is on her face, arms, legs, etc. Pt concerned she is having a reaction to the med. Pt denies throat tenderness, swelling or difficulty breathing.

## 2019-06-17 NOTE — ED Triage Notes (Signed)
Pt reports no rash when she was taking 25mg  of the med but it started when she increased her dose after a week so unsure if it is the med or something else.

## 2019-06-17 NOTE — ED Notes (Signed)
See triage note   States she was placed on Lamictal about 2 weeks ago  Started off with 25 mg at bedtime  Then went to 50 mg 2 days ago  Developed some itching to back yesterday  Then rash to face,chest ,arms and legs  No resp distress noted

## 2019-06-17 NOTE — ED Provider Notes (Signed)
[facility] Emergency Department Provider Note   ____________________________________________   First MD Initiated Contact with Patient 06/17/19 [phone]     (approximate)  I have reviewed the triage vital signs and the nursing notes.   HISTORY  Chief Complaint Rash    HPI Jodi Rogers is a 23 y.o. female patient presents for rash after starting Lamictal.  Patient states 2 weeks ago she started a low dose of 25 mg with no side effects.  Patient states yesterday she increased the dose and noticed itching.  Patient awakened this morning with a diffuse rash.  Patient denies any anaphylactic signs and symptoms i.e. oral edema or dyspnea.  Patient notify her PCP was told to come to the emergency room.  No palliative measure for complaint.         Past Medical History:  Diagnosis Date  . Medical history non-contributory   . Scoliosis     Patient Active Problem List   Diagnosis Date Noted  . Hematemesis with nausea   . Intractable nausea and vomiting 08/11/2016    Past Surgical History:  Procedure Laterality Date  . ESOPHAGOGASTRODUODENOSCOPY N/A 08/13/2016   Procedure: ESOPHAGOGASTRODUODENOSCOPY (EGD);  Surgeon: Manya Silvas, MD;  Location: [facility] ENDOSCOPY;  Service: Endoscopy;  Laterality: N/A;  . NO PAST SURGERIES      Prior to Admission medications   Medication Sig Start Date End Date Taking? Authorizing Provider  lamoTRIgine (LAMICTAL) 25 MG tablet Take 50 mg by mouth daily.   Yes [provider]  hydrOXYzine (ATARAX/VISTARIL) 50 MG tablet Take 1 tablet (50 mg total) by mouth 3 (three) times daily as needed for itching. 06/17/19   Sable Feil, PA-C  methylPREDNISolone (MEDROL DOSEPAK) 4 MG TBPK tablet Take Tapered dose as directed 06/17/19   Sable Feil, PA-C    Allergies Hydrocodone  Family History  Problem Relation Age of Onset  . Diabetes Neg Hx     Social History Social History   Tobacco Use  . Smoking status:  Current Every Day Smoker    Packs/day: 0.50    Types: Cigarettes  . Smokeless tobacco: Former Systems developer    Quit date: 02/21/2018  . Tobacco comment: pt states she quit  Substance Use Topics  . Alcohol use: Yes    Comment: sometimes  . Drug use: Yes    Types: Marijuana    Review of Systems Constitutional: No fever/chills Eyes: No visual changes. ENT: No sore throat. Cardiovascular: Denies chest pain. Respiratory: Denies shortness of breath. Gastrointestinal: No abdominal pain.  No nausea, no vomiting.  No diarrhea.  No constipation. Genitourinary: Negative for dysuria. Musculoskeletal: Negative for back pain. Skin: Positive for rash. Neurological: Negative for headaches, focal weakness or numbness. Allergic/Immunilogical: Hydrocodone ____________________________________________   PHYSICAL EXAM:  VITAL SIGNS: ED Triage Vitals  Enc Vitals Group     BP 06/17/19 0851 112/75     Pulse Rate 06/17/19 0851 82     Resp 06/17/19 0851 18     Temp 06/17/19 0851 98.7 F (37.1 C)     Temp Source 06/17/19 0851 Oral     SpO2 06/17/19 0851 98 %     Weight 06/17/19 0849 160 lb (72.6 kg)     Height 06/17/19 0849 5\' 10"  (1.778 m)     Head Circumference --      Peak Flow --      Pain Score 06/17/19 0849 0     Pain Loc --      Pain Edu? --  Excl. in GC? --    Constitutional: Alert and oriented. Well appearing and in no acute distress. Neck: No stridor.   Hematological/Lymphatic/Immunilogical: No cervical lymphadenopathy. Cardiovascular: Normal rate, regular rhythm. Grossly normal heart sounds.  Good peripheral circulation. Respiratory: Normal respiratory effort.  No retractions. Lungs CTAB. Gastrointestinal: Soft and nontender. No distention. No abdominal bruits. No CVA tenderness. Musculoskeletal: No lower extremity tenderness nor edema.  No joint effusions. Neurologic:  Normal speech and language. No gross focal neurologic deficits are appreciated. No gait instability. Skin:  Skin  is warm, dry and intact.  Not erythematous macular lesions    psychiatric: Mood and affect are normal. Speech and behavior are normal.  ____________________________________________   LABS (all labs ordered are listed, but only abnormal results are displayed)  Labs Reviewed - No data to display ____________________________________________  EKG   ____________________________________________  RADIOLOGY  ED MD interpretation:    Official radiology report(s): No results found.  ____________________________________________   PROCEDURES  Procedure(s) performed (including Critical Care):  Procedures   ____________________________________________   INITIAL IMPRESSION / ASSESSMENT AND PLAN / ED COURSE  As part of my medical decision making, I reviewed the following data within the electronic MEDICAL RECORD NUMBER         Jodi Rogers was evaluated in Emergency Department on 06/17/2019 for the symptoms described in the history of present illness. She was evaluated in the context of the global COVID-19 pandemic, which necessitated consideration that the patient might be at risk for infection with the SARS-CoV-2 virus that causes COVID-19. Institutional protocols and algorithms that pertain to the evaluation of patients at risk for COVID-19 are in a state of rapid change based on information released by regulatory bodies including the CDC and federal and state organizations. These policies and algorithms were followed during the patient's care in the ED.  Patient referred for rash secondary to starting Lamictal.  No anaphylactic signs or symptoms.  Patient advised to continue medication and notify PCP.  Patient given prescription for Atarax and Medrol Dosepak.      ____________________________________________   FINAL CLINICAL IMPRESSION(S) / ED DIAGNOSES  Final diagnoses:  Allergic reaction, initial encounter     ED Discharge Orders         Ordered    hydrOXYzine  (ATARAX/VISTARIL) 50 MG tablet  3 times daily PRN     06/17/19 0903    methylPREDNISolone (MEDROL DOSEPAK) 4 MG TBPK tablet     06/17/19 [id number]           Note:  This document was prepared using Dragon voice recognition software and may include unintentional dictation errors.    Joni ReiningSmith, Ronald K, PA-C 06/17/19 [phone]    Dionne BucySiadecki, Sebastian, MD 06/18/19 [phone]

## 2019-06-17 NOTE — Discharge Instructions (Addendum)
Discontinue Lamictal and follow-up PCP for change in medical therapy.

## 2019-07-07 IMAGING — US ULTRASOUND ABDOMEN COMPLETE
1 series · 14 of 25 positions shown · non-contrast
Comparison: CT abdomen and pelvis August 07, 2016

CLINICAL DATA: Upper abdominal pain, primarily left-sided

EXAM:
ABDOMEN ULTRASOUND COMPLETE

[Series 1: ultrasound abdomen complete · 0.19mm/px · 14 of 93 slices shown]
[im 1/93]
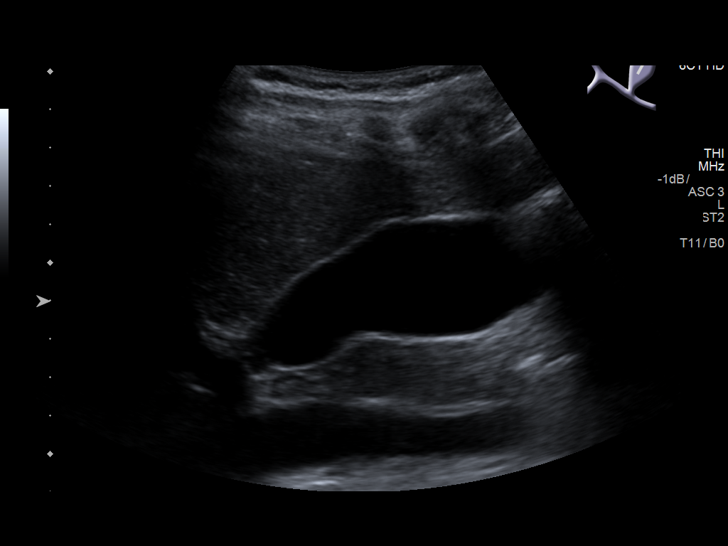
[im 8/93]
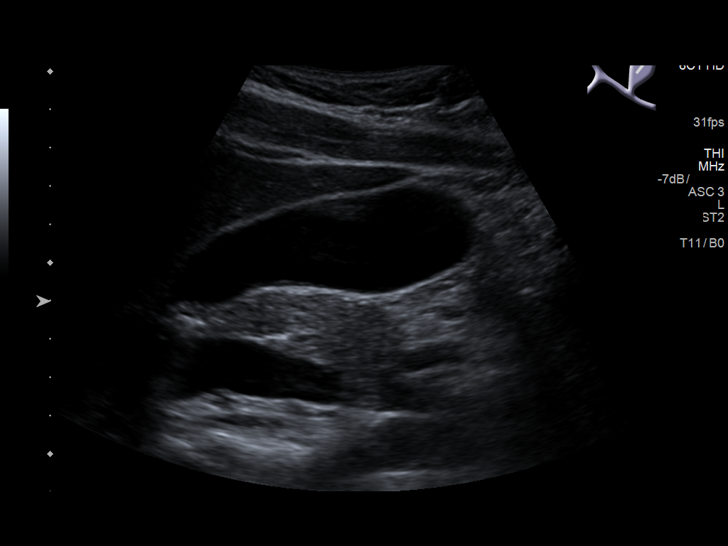
[im 16/93]
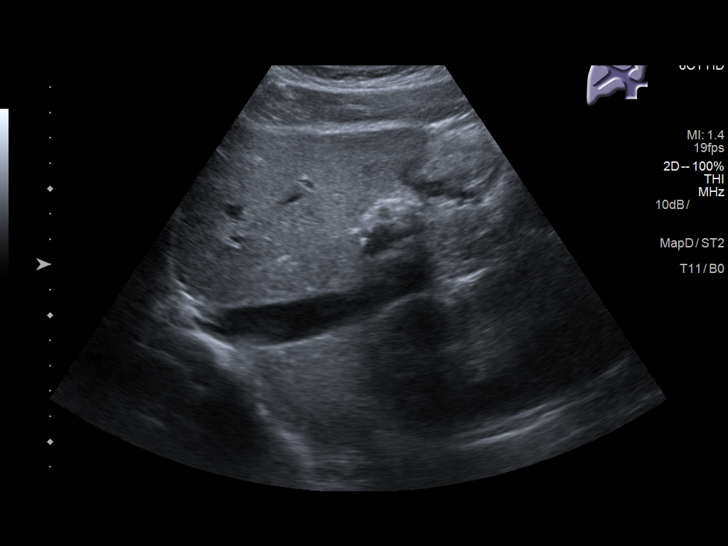
[im 24/93]
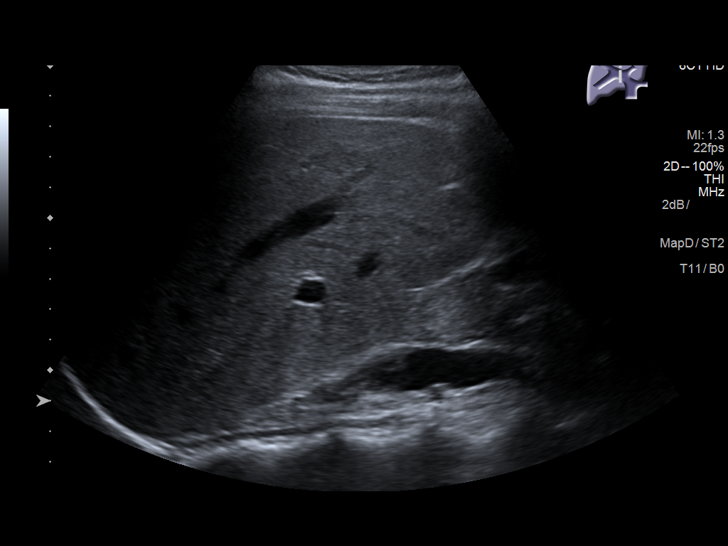
[im 31/93]
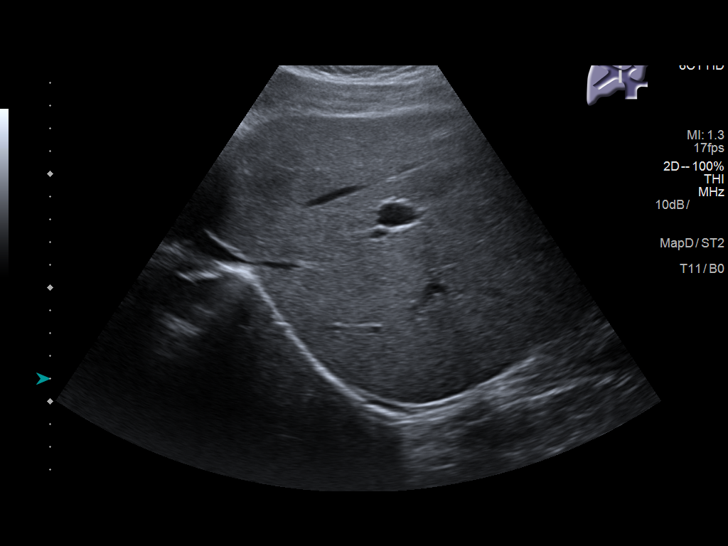
[im 35/93]
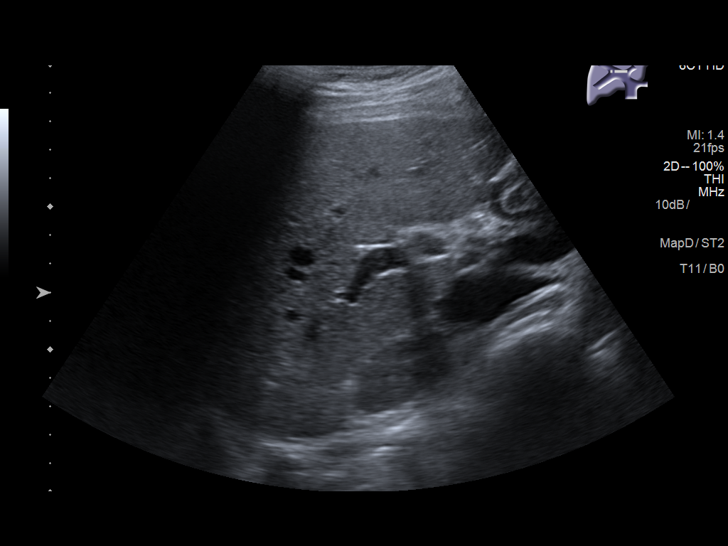
[im 43/93]
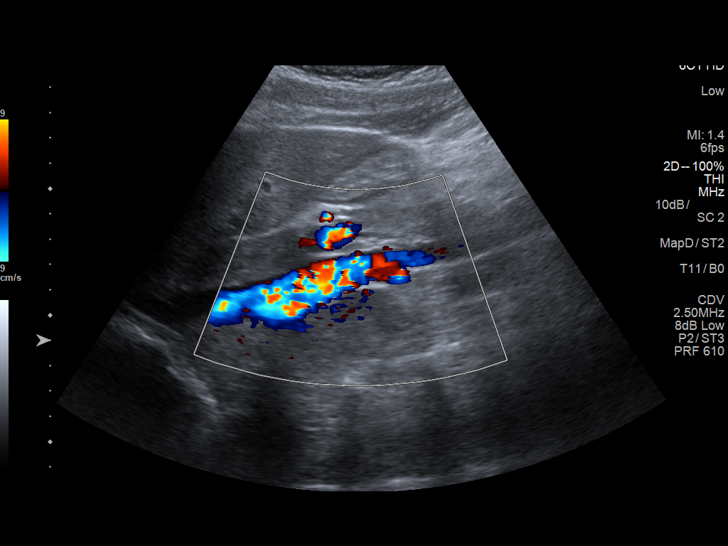
[im 50/93]
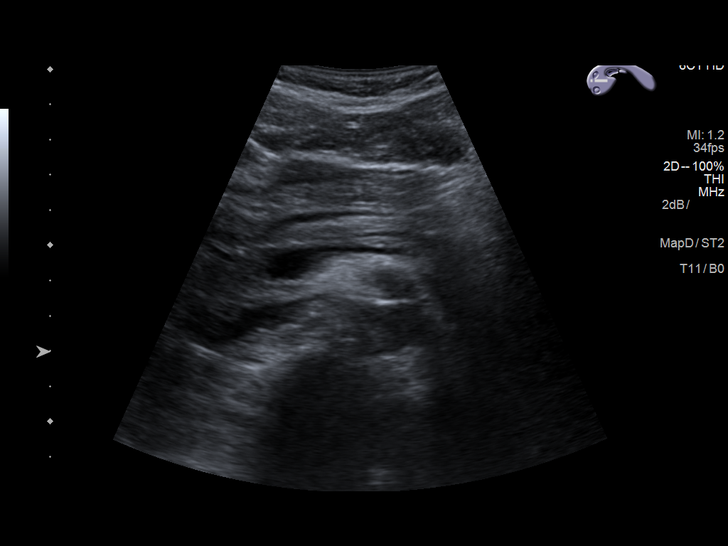
[im 58/93]
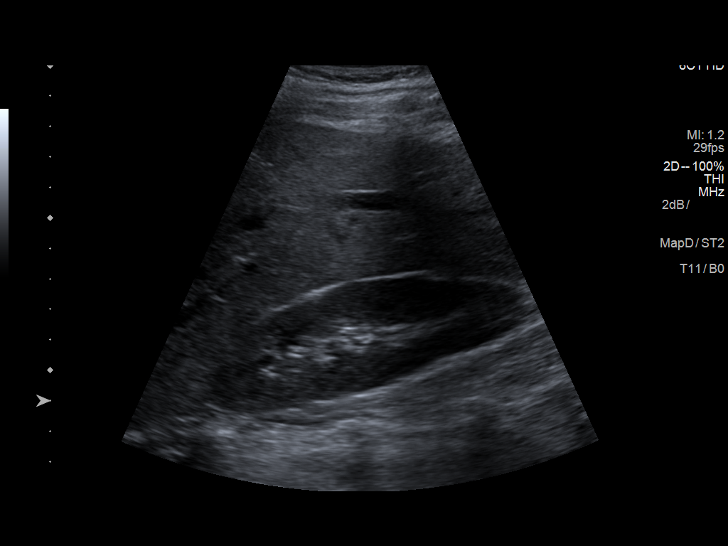
[im 62/93]
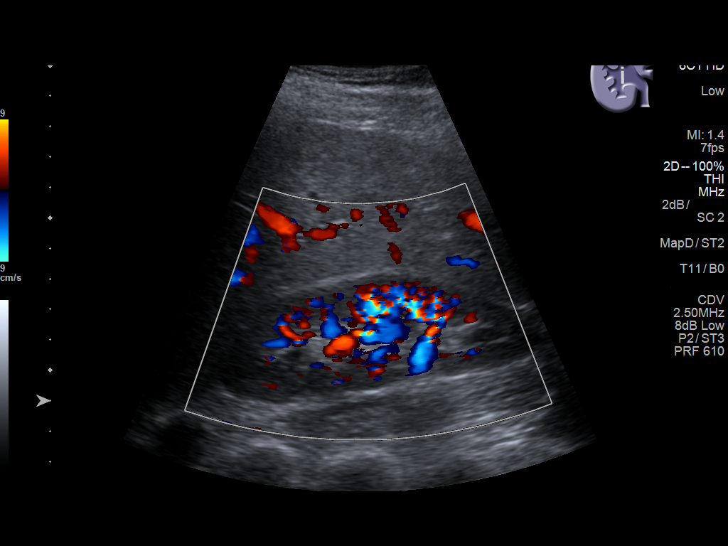
[im 70/93]
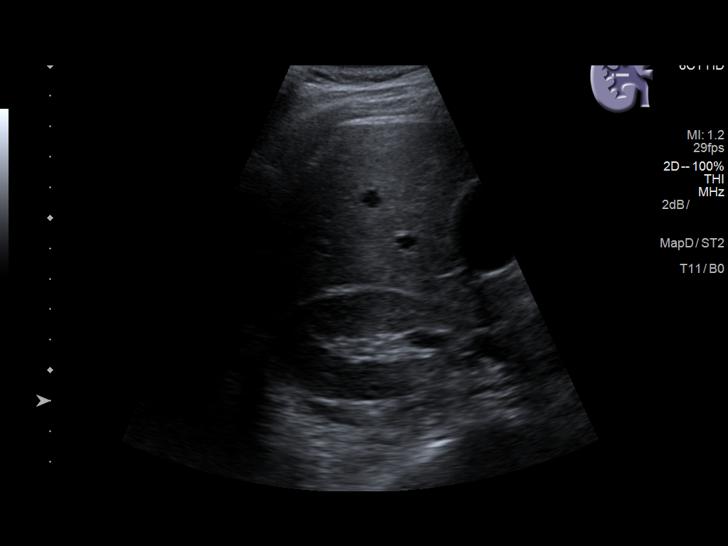
[im 77/93]
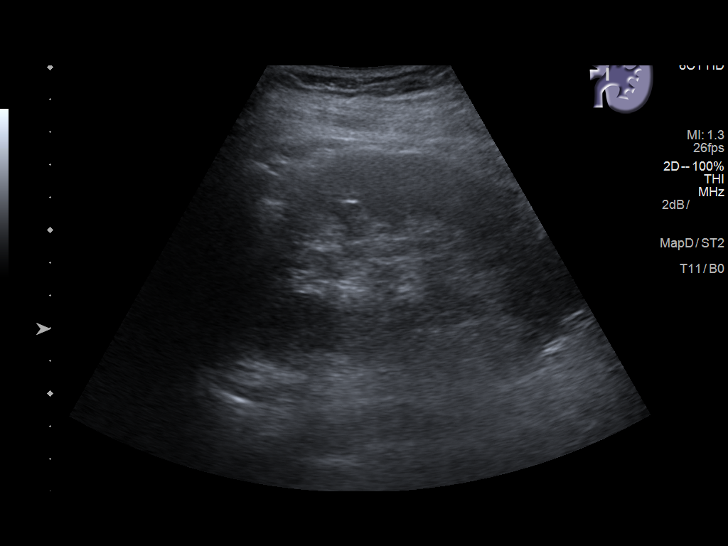
[im 85/93]
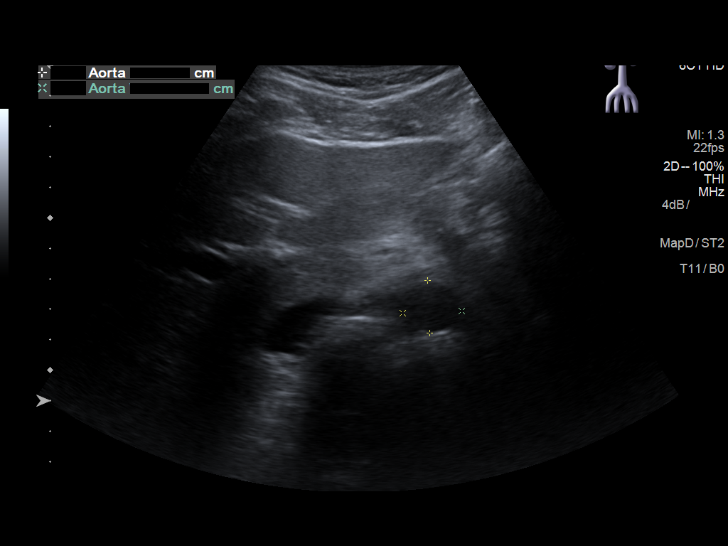
[im 93/93]
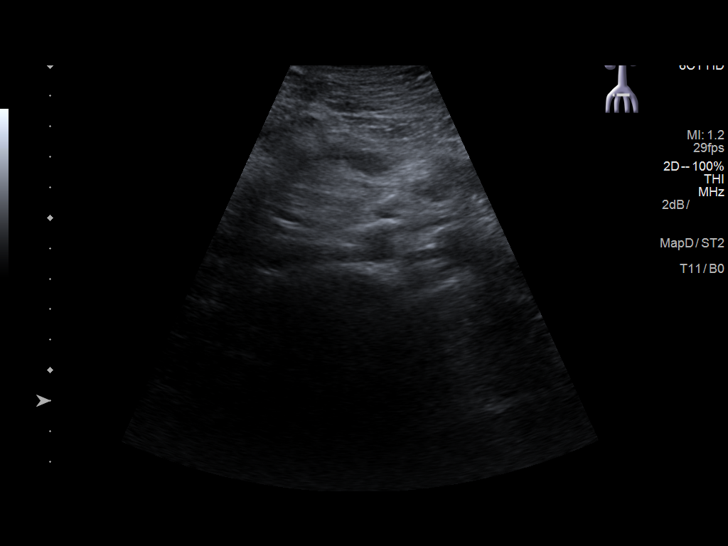

[14 of 25 positions shown; findings below may reference images not displayed]

FINDINGS: Gallbladder: No gallstones or wall thickening visualized. There is
no pericholecystic fluid. No sonographic Murphy sign noted by
sonographer.

Common bile duct: Diameter: 3 mm. No intrahepatic, common hepatic,
or common bile duct dilatation.

Liver: No focal lesion identified. Within normal limits in
parenchymal echogenicity. Portal vein is patent on color Doppler
imaging with normal direction of blood flow towards the liver.

IVC: No abnormality visualized.

Pancreas: No pancreatic mass or inflammatory focus.

Spleen: Size and appearance within normal limits.

Right Kidney: Length: 11.0 cm. Echogenicity within normal limits. No
mass or hydronephrosis visualized.

Left Kidney: Length: 11.6 cm. Echogenicity within normal limits. No
mass or hydronephrosis visualized.

Abdominal aorta: No aneurysm visualized.

Other findings: No demonstrable ascites.
IMPRESSION: Study within normal limits.

## 2019-08-02 ENCOUNTER — Other Ambulatory Visit: Payer: Self-pay

## 2019-08-05 ENCOUNTER — Other Ambulatory Visit
Admission: RE | Admit: 2019-08-05 | Discharge: 2019-08-05 | Disposition: A | Discharge status: Home / Self Care | Payer: BC Managed Care – PPO | Attending: Gastroenterology | Admitting: Gastroenterology

## 2019-08-05 ENCOUNTER — Encounter: Payer: Self-pay | Admitting: Gastroenterology

## 2019-08-05 ENCOUNTER — Encounter
Admission: RE | Admit: 2019-08-05 | Discharge: 2019-08-05 | Disposition: A | Discharge status: Home / Self Care | Payer: BC Managed Care – PPO | Source: Ambulatory Visit | Attending: Gastroenterology | Admitting: Gastroenterology

## 2019-08-05 ENCOUNTER — Other Ambulatory Visit: Payer: Self-pay

## 2019-08-05 ENCOUNTER — Ambulatory Visit (INDEPENDENT_AMBULATORY_CARE_PROVIDER_SITE_OTHER): Payer: BC Managed Care – PPO | Admitting: Gastroenterology

## 2019-08-05 VITALS — BP 119/49 | HR 77 | Temp 98.4°F | Ht 70.0 in | Wt 179.2 lb

## 2019-08-05 DIAGNOSIS — K920 Hematemesis: Secondary | ICD-10-CM

## 2019-08-05 DIAGNOSIS — Z01812 Encounter for preprocedural laboratory examination: Secondary | ICD-10-CM | POA: Diagnosis present

## 2019-08-05 DIAGNOSIS — Z20828 Contact with and (suspected) exposure to other viral communicable diseases: Secondary | ICD-10-CM | POA: Insufficient documentation

## 2019-08-05 DIAGNOSIS — R197 Diarrhea, unspecified: Secondary | ICD-10-CM

## 2019-08-05 LAB — C DIFFICILE QUICK SCREEN W PCR REFLEX
C Diff antigen: NEGATIVE
C Diff interpretation: NOT DETECTED
C Diff toxin: NEGATIVE

## 2019-08-05 LAB — CBC
HCT: 38.6 % (ref 36.0–46.0)
Hemoglobin: 12.8 g/dL (ref 12.0–15.0)
MCH: 29.8 pg (ref 26.0–34.0)
MCHC: 33.2 g/dL (ref 30.0–36.0)
MCV: 89.8 fL (ref 80.0–100.0)
Platelets: 248 10*3/uL (ref 150–400)
RBC: 4.3 MIL/uL (ref 3.87–5.11)
RDW: 15.4 % (ref 11.5–15.5)
WBC: 8.9 10*3/uL (ref 4.0–10.5)
nRBC: 0 % (ref 0.0–0.2)

## 2019-08-05 LAB — C-REACTIVE PROTEIN: CRP: <0.8 mg/dL (ref <1.0)

## 2019-08-05 LAB — SEDIMENTATION RATE: Sed Rate: 6 mm/hr (ref 0–20)

## 2019-08-05 LAB — SARS CORONAVIRUS 2 (TAT 6-24 HRS): SARS Coronavirus 2: NEGATIVE

## 2019-08-05 NOTE — Progress Notes (Signed)
CBC

## 2019-08-05 NOTE — Progress Notes (Signed)
Jodi Rogers [address]  [place]  [place], [place] [address]  Main: [phone]  Fax: [phone]   Gastroenterology Consultation  Referring Provider:     Casilda Carls, MD Primary Care Physician:  Casilda Carls, MD Reason for Consultation:    Intermittent nausea and vomiting with blood        HPI:    Chief Complaint  Patient presents with  . Hematemesis    Patient states this happen once. Patient states she vomits off and on for the last couple of years. Patient has had LLQ pain this weekend that is sharp. Patient has had nausea   . New Patient (Initial Visit)    Jodi Rogers is a 23 y.o. y/o female referred for consultation & management  by Dr. Casilda Carls, MD.  Patient reports 3 to 25-month history of intermittent nausea and vomiting, with 1-2 episodes with blood.  Symptoms have improved over the last 1 to 2 weeks, but occur once or twice a month.  No dysphagia.  Also reports intermittent left lower quadrant abdominal pain that is severe while she is on her menses.  She has not discussed this with a GYN.  No blood in stool.  Has had some loose stools recently.  No fever or chills.  No weight loss.  No family history of GI malignancy.  Had an upper endoscopy in 2017 for hematemesis which reported esophagitis and gastric inflammation.  Biopsies were negative for H. pylori.  Past Medical History:  Diagnosis Date  . Medical history non-contributory   . Scoliosis     Past Surgical History:  Procedure Laterality Date  . ESOPHAGOGASTRODUODENOSCOPY N/A 08/13/2016   Procedure: ESOPHAGOGASTRODUODENOSCOPY (EGD);  Surgeon: Manya Silvas, MD;  Location: [facility] ENDOSCOPY;  Service: Endoscopy;  Laterality: N/A;  . NO PAST SURGERIES      Prior to Admission medications   Medication Sig Start Date End Date Taking? Authorizing Provider  omeprazole (PRILOSEC) 20 MG capsule Take by mouth daily. 02/25/19  Yes [provider]    Family History  Problem  Relation Age of Onset  . Diabetes Neg Hx      Social History   Tobacco Use  . Smoking status: Current Every Day Smoker    Packs/day: 0.50    Types: Cigarettes  . Smokeless tobacco: Former Systems developer    Quit date: 02/21/2018  . Tobacco comment: pt states she quit  Substance Use Topics  . Alcohol use: Yes    Comment: sometimes  . Drug use: Yes    Types: Marijuana    Allergies as of 08/05/2019 - Review Complete 08/05/2019  Allergen Reaction Noted  . Promethazine Nausea And Vomiting 08/02/2019  . Hydrocodone Nausea And Vomiting 05/13/2017    Review of Systems:    All systems reviewed and negative except where noted in HPI.   Physical Exam:  BP (!) 119/49 (BP Location: Left Arm, Patient Position: Sitting, Cuff Size: Normal)   Pulse 77   Temp 98.4 F (36.9 C) (Oral)   Ht 5\' 10"  (1.778 m)   Wt 179 lb 4 oz (81.3 kg)   BMI 25.72 kg/m  No LMP recorded. Psych:  Alert and cooperative. Normal mood and affect. General:   Alert,  Well-developed, well-nourished, pleasant and cooperative in NAD Head:  Normocephalic and atraumatic. Eyes:  Sclera clear, no icterus.   Conjunctiva pink. Ears:  Normal auditory acuity. Nose:  No deformity, discharge, or lesions. Mouth:  No deformity or lesions,oropharynx pink & moist. Neck:  Supple;  no masses or thyromegaly. Abdomen:  Normal bowel sounds.  No bruits.  Soft, non-tender and non-distended without masses, hepatosplenomegaly or hernias noted.  No guarding or rebound tenderness.    Msk:  Symmetrical without gross deformities. Good, equal movement & strength bilaterally. Pulses:  Normal pulses noted. Extremities:  No clubbing or edema.  No cyanosis. Neurologic:  Alert and oriented x3;  grossly normal neurologically. Skin:  Intact without significant lesions or rashes. No jaundice. Lymph Nodes:  No significant cervical adenopathy. Psych:  Alert and cooperative. Normal mood and affect.   Labs: CBC    Component Value Date/Time   WBC 8.9  08/05/2019 1057   RBC 4.30 08/05/2019 1057   HGB 12.8 08/05/2019 1057   HCT 38.6 08/05/2019 1057   PLT 248 08/05/2019 1057   MCV 89.8 08/05/2019 1057   MCH 29.8 08/05/2019 1057   MCHC 33.2 08/05/2019 1057   RDW 15.4 08/05/2019 1057   LYMPHSABS 1.1 02/22/2019 1253   MONOABS 0.8 02/22/2019 1253   EOSABS 0.0 02/22/2019 1253   BASOSABS 0.0 02/22/2019 1253   CMP     Component Value Date/Time   NA 139 02/22/2019 1253   K 3.2 (L) 02/22/2019 1253   CL 107 02/22/2019 1253   CO2 16 (L) 02/22/2019 1253   GLUCOSE 120 (H) 02/22/2019 1253   BUN 18 02/22/2019 1253   CREATININE 0.93 02/22/2019 1253   CALCIUM 9.6 02/22/2019 1253   PROT 7.4 02/22/2019 1253   ALBUMIN 4.6 02/22/2019 1253   AST 23 02/22/2019 1253   ALT 15 02/22/2019 1253   ALKPHOS 90 02/22/2019 1253   BILITOT 0.7 02/22/2019 1253   GFRNONAA >60 02/22/2019 1253   GFRAA >60 02/22/2019 1253    Imaging Studies: Abdominal ultrasound normal in April 2020  Assessment and Plan:   Jodi Rogers is a 23 y.o. y/o female has been referred for nausea, vomiting, hematemesis  Blood work obtained today has been reassuring so far with no anemia  Due to intermittent loose stools will obtain stool work-up to rule out infections and inflammatory markers  Based on above work-up, may need to proceed with upper endoscopy given intermittent hematemesis.  If inflammatory markers are elevated, patient may need colonoscopy along with EGD  Patient asked to abstain from all drugs including marijuana  I have asked her to follow-up with GYN in regard to her left lower quadrant abdominal pain that worsens at the time of her periods to get evaluation for endometriosis.  Dr Melodie BouillonVarnita Ismael Treptow  Speech recognition software was used to dictate the above note.

## 2019-08-06 ENCOUNTER — Telehealth: Payer: Self-pay

## 2019-08-06 NOTE — Telephone Encounter (Signed)
Patient verbalized understanding of lab work  

## 2019-08-06 NOTE — Telephone Encounter (Signed)
-----   Message from Virgel Manifold, MD sent at 08/06/2019  9:24 AM EDT ----- Jodi Rogers please let the patient know, bloodwork so far looks normal. Stool tests are pending. C. Diff was negative

## 2019-08-07 ENCOUNTER — Encounter: Payer: Self-pay | Admitting: *Deleted

## 2019-08-08 ENCOUNTER — Other Ambulatory Visit: Payer: Self-pay

## 2019-08-08 ENCOUNTER — Ambulatory Visit: Payer: BC Managed Care – PPO | Admitting: Anesthesiology

## 2019-08-08 ENCOUNTER — Encounter: Payer: Self-pay | Admitting: *Deleted

## 2019-08-08 ENCOUNTER — Ambulatory Visit
Admission: RE | Admit: 2019-08-08 | Discharge: 2019-08-08 | Disposition: A | Discharge status: Home / Self Care | Payer: BC Managed Care – PPO | Attending: Gastroenterology | Admitting: Gastroenterology

## 2019-08-08 ENCOUNTER — Encounter
Admission: RE | Disposition: A | Discharge status: Home / Self Care | Payer: Self-pay | Source: Home / Self Care | Attending: Gastroenterology

## 2019-08-08 DIAGNOSIS — F17210 Nicotine dependence, cigarettes, uncomplicated: Secondary | ICD-10-CM | POA: Diagnosis not present

## 2019-08-08 DIAGNOSIS — K920 Hematemesis: Secondary | ICD-10-CM | POA: Insufficient documentation

## 2019-08-08 DIAGNOSIS — K219 Gastro-esophageal reflux disease without esophagitis: Secondary | ICD-10-CM | POA: Diagnosis not present

## 2019-08-08 DIAGNOSIS — R197 Diarrhea, unspecified: Secondary | ICD-10-CM

## 2019-08-08 DIAGNOSIS — Z79899 Other long term (current) drug therapy: Secondary | ICD-10-CM | POA: Insufficient documentation

## 2019-08-08 HISTORY — PX: ESOPHAGOGASTRODUODENOSCOPY (EGD) WITH PROPOFOL: SHX5813

## 2019-08-08 HISTORY — DX: Gastro-esophageal reflux disease without esophagitis: K21.9

## 2019-08-08 LAB — GI PATHOGEN PANEL BY PCR, STOOL

## 2019-08-08 LAB — POCT PREGNANCY, URINE: Preg Test, Ur: NEGATIVE

## 2019-08-08 SURGERY — ESOPHAGOGASTRODUODENOSCOPY (EGD) WITH PROPOFOL
Anesthesia: General

## 2019-08-08 MED ORDER — SODIUM CHLORIDE 0.9 % IV SOLN
INTRAVENOUS | Status: DC
  Administered 2019-08-08: 10:00:00 via INTRAVENOUS

## 2019-08-08 MED ORDER — PROPOFOL 500 MG/50ML IV EMUL
INTRAVENOUS | Status: AC
  Filled 2019-08-08: qty 50

## 2019-08-08 MED ORDER — PROPOFOL 10 MG/ML IV BOLUS
INTRAVENOUS | Status: DC | PRN
  Administered 2019-08-08: 100 mg via INTRAVENOUS
  Administered 2019-08-08 (×2): 30 mg via INTRAVENOUS
  Administered 2019-08-08: 20 mg via INTRAVENOUS

## 2019-08-08 MED ORDER — PROPOFOL 500 MG/50ML IV EMUL
INTRAVENOUS | Status: DC | PRN
  Administered 2019-08-08: 175 ug/kg/min via INTRAVENOUS

## 2019-08-08 NOTE — Anesthesia Preprocedure Evaluation (Addendum)
Anesthesia Evaluation  Patient identified by MRN, date of birth, ID band Patient awake    Reviewed: Allergy & Precautions, H&P , NPO status , Patient's Chart, lab work & pertinent test results  Airway Mallampati: I  TM Distance: >3 FB     Dental  (+) Teeth Intact   Pulmonary neg shortness of breath, neg recent URI, Current Smoker,           Cardiovascular negative cardio ROS       Neuro/Psych negative neurological ROS  negative psych ROS   GI/Hepatic Neg liver ROS, GERD  Controlled,  Endo/Other  negative endocrine ROS  Renal/GU negative Renal ROS  negative genitourinary   Musculoskeletal   Abdominal   Peds  Hematology negative hematology ROS (+)   Anesthesia Other Findings Past Medical History: No date: GERD (gastroesophageal reflux disease) No date: Medical history non-contributory No date: Scoliosis  Past Surgical History: 08/13/2016: ESOPHAGOGASTRODUODENOSCOPY; N/A     Comment:  Procedure: ESOPHAGOGASTRODUODENOSCOPY (EGD);  Surgeon:               Manya Silvas, MD;  Location: [facility] ENDOSCOPY;                Service: Endoscopy;  Laterality: N/A;  BMI    Body Mass Index: 25.40 kg/m      Reproductive/Obstetrics negative OB ROS                            Anesthesia Physical Anesthesia Plan  ASA: II  Anesthesia Plan: General   Post-op Pain Management:    Induction:   PONV Risk Score and Plan: Propofol infusion and TIVA  Airway Management Planned: Natural Airway and Nasal Cannula  Additional Equipment:   Intra-op Plan:   Post-operative Plan:   Informed Consent: I have reviewed the patients History and Physical, chart, labs and discussed the procedure including the risks, benefits and alternatives for the proposed anesthesia with the patient or authorized representative who has indicated his/her understanding and acceptance.     Dental Advisory Given  Plan  Discussed with: Anesthesiologist and CRNA  Anesthesia Plan Comments:        Anesthesia Quick Evaluation

## 2019-08-08 NOTE — Anesthesia Post-op Follow-up Note (Signed)
Anesthesia QCDR form completed.        

## 2019-08-08 NOTE — Op Note (Signed)
[facility] Patient Name: Jodi Rogers Procedure Date: 08/08/2019 9:27 AM MRN: [id number] Account #: [id number] Date of Birth: 28-Sep-1996 Admit Type: Outpatient Age: 23 Room: [facility] ENDO ROOM 4 Gender: Female Note Status: Finalized Procedure:            Upper GI endoscopy Indications:          Hematemesis Providers:            Jonathon Bellows MD, MD Referring MD:         Casilda Carls, MD (Referring MD) Medicines:            Monitored Anesthesia Care Complications:        No immediate complications. Procedure:            Pre-Anesthesia Assessment:                       - Prior to the procedure, a History and Physical was                        performed, and patient medications, allergies and                        sensitivities were reviewed. The patient's tolerance of                        previous anesthesia was reviewed.                       - The risks and benefits of the procedure and the                        sedation options and risks were discussed with the                        patient. All questions were answered and informed                        consent was obtained.                       - ASA Grade Assessment: I - A normal, healthy patient.                       After obtaining informed consent, the endoscope was                        passed under direct vision. Throughout the procedure,                        the patient's blood pressure, pulse, and oxygen                        saturations were monitored continuously. The Endoscope                        was introduced through the mouth, and advanced to the                        third part of duodenum. The upper GI endoscopy was  accomplished with ease. The patient tolerated the                        procedure well. Findings:      The esophagus was normal.      The stomach was normal.      The examined duodenum was normal. Impression:           - Normal  esophagus.                       - Normal stomach.                       - Normal examined duodenum.                       - No specimens collected. Recommendation:       - Discharge patient to home (with escort).                       - Resume previous diet.                       - Continue present medications.                       - F/u with DR Maximino Greenland as an outpatient Procedure Code(s):    --- Professional ---                       [phone], Esophagogastroduodenoscopy, flexible, transoral;                        diagnostic, including collection of specimen(s) by                        brushing or washing, when performed (separate procedure) Diagnosis Code(s):    --- Professional ---                       K92.0, Hematemesis CPT copyright 2019 American Medical Association. All rights reserved. The codes documented in this report are preliminary and upon coder review may  be revised to meet current compliance requirements. Wyline Mood, MD Wyline Mood MD, MD 08/08/2019 9:40:48 AM This report has been signed electronically. Number of Addenda: 0 Note Initiated On: 08/08/2019 9:27 AM Estimated Blood Loss: Estimated blood loss: none.      [facility]

## 2019-08-08 NOTE — H&P (Signed)
Jonathon Bellows, MD [address] [place], [place], [place], [address] [address], [place], [place], [place], [address] Phone: [phone]  Fax: [phone]  Primary Care Physician:  Casilda Carls, MD   Pre-Procedure History & Physical: HPI:  Jodi Rogers is a 23 y.o. female is here for an endoscopy    Past Medical History:  Diagnosis Date  . GERD (gastroesophageal reflux disease)   . Medical history non-contributory   . Scoliosis     Past Surgical History:  Procedure Laterality Date  . ESOPHAGOGASTRODUODENOSCOPY N/A 08/13/2016   Procedure: ESOPHAGOGASTRODUODENOSCOPY (EGD);  Surgeon: Manya Silvas, MD;  Location: [facility] ENDOSCOPY;  Service: Endoscopy;  Laterality: N/A;    Prior to Admission medications   Medication Sig Start Date End Date Taking? Authorizing Provider  omeprazole (PRILOSEC) 20 MG capsule Take by mouth daily. 02/25/19  Yes [provider]    Allergies as of 08/05/2019 - Review Complete 08/05/2019  Allergen Reaction Noted  . Promethazine Nausea And Vomiting 08/02/2019  . Hydrocodone Nausea And Vomiting 05/13/2017    Family History  Problem Relation Age of Onset  . Diabetes Neg Hx     Social History   Socioeconomic History  . Marital status: Single    Spouse name: Not on file  . Number of children: Not on file  . Years of education: Not on file  . Highest education level: Not on file  Occupational History  . Not on file  Social Needs  . Financial resource strain: Not on file  . Food insecurity    Worry: Not on file    Inability: Not on file  . Transportation needs    Medical: Not on file    Non-medical: Not on file  Tobacco Use  . Smoking status: Current Every Day Smoker    Packs/day: 0.50    Types: Cigarettes  . Smokeless tobacco: Former Systems developer    Quit date: 02/21/2018  . Tobacco comment: pt states she quit  Substance and Sexual Activity  . Alcohol use: Yes    Comment: sometimes  . Drug use: Yes    Types: Marijuana   . Sexual activity: Not on file  Lifestyle  . Physical activity    Days per week: Not on file    Minutes per session: Not on file  . Stress: Not on file  Relationships  . Social Herbalist on phone: Not on file    Gets together: Not on file    Attends religious service: Not on file    Active member of club or organization: Not on file    Attends meetings of clubs or organizations: Not on file    Relationship status: Not on file  . Intimate partner violence    Fear of current or ex partner: Not on file    Emotionally abused: Not on file    Physically abused: Not on file    Forced sexual activity: Not on file  Other Topics Concern  . Not on file  Social History Narrative  . Not on file    Review of Systems: See HPI, otherwise negative ROS  Physical Exam: BP 123/75   Pulse 72   Temp (!) 97 F (36.1 C) (Tympanic)   Resp 16   Ht 5\' 10"  (1.778 m)   Wt 80.3 kg   LMP 07/25/2019 Comment: neg preg test 08/08/19  SpO2 100%   BMI 25.40 kg/m  General:   Alert,  pleasant and cooperative in NAD  Head:  Normocephalic and atraumatic. Neck:  Supple; no masses or thyromegaly. Lungs:  Clear throughout to auscultation, normal respiratory effort.    Heart:  +S1, +S2, Regular rate and rhythm, No edema. Abdomen:  Soft, nontender and nondistended. Normal bowel sounds, without guarding, and without rebound.   Neurologic:  Alert and  oriented x4;  grossly normal neurologically.  Impression/Plan: Jodi Rogers is here for an endoscopy  to be performed for  evaluation of hematemesis    Risks, benefits, limitations, and alternatives regarding endoscopy have been reviewed with the patient.  Questions have been answered.  All parties agreeable.   Wyline Mood, MD  08/08/2019, 9:23 AM

## 2019-08-08 NOTE — Transfer of Care (Signed)
Immediate Anesthesia Transfer of Care Note  Patient: Agricultural consultant  Procedure(s) Performed: ESOPHAGOGASTRODUODENOSCOPY (EGD) WITH PROPOFOL (N/A )  Patient Location: PACU  Anesthesia Type:General  Level of Consciousness: drowsy  Airway & Oxygen Therapy: Patient Spontanous Breathing and Patient connected to nasal cannula oxygen  Post-op Assessment: Report given to RN and Post -op Vital signs reviewed and stable  Post vital signs: Reviewed and stable  Last Vitals:  Vitals Value Taken Time  BP 108/66 08/08/19 0947  Temp    Pulse 40 08/08/19 0951  Resp 25 08/08/19 0951  SpO2 99 % 08/08/19 0951  Vitals shown include unvalidated device data.  Last Pain:  Vitals:   08/08/19 0851  TempSrc: Tympanic  PainSc: 0-No pain         Complications: No apparent anesthesia complications

## 2019-08-09 NOTE — Anesthesia Postprocedure Evaluation (Signed)
Anesthesia Post Note  Patient: Agricultural consultant  Procedure(s) Performed: ESOPHAGOGASTRODUODENOSCOPY (EGD) WITH PROPOFOL (N/A )  Patient location during evaluation: PACU Anesthesia Type: General Level of consciousness: awake and alert Pain management: pain level controlled Vital Signs Assessment: post-procedure vital signs reviewed and stable Respiratory status: spontaneous breathing, nonlabored ventilation and respiratory function stable Cardiovascular status: blood pressure returned to baseline and stable Postop Assessment: no apparent nausea or vomiting Anesthetic complications: no     Last Vitals:  Vitals:   08/08/19 0957 08/08/19 1007  BP: (!) 100/55 (!) 108/58  Pulse: 63 73  Resp: 15 16  Temp:    SpO2: 100% 100%    Last Pain:  Vitals:   08/08/19 1007  TempSrc:   PainSc: 0-No pain                 Durenda Hurt

## 2019-08-12 LAB — CALPROTECTIN, FECAL: Calprotectin, Fecal: <16 ug/g (ref 0–120)

## 2019-09-02 ENCOUNTER — Telehealth: Payer: Self-pay

## 2019-09-02 NOTE — Telephone Encounter (Signed)
Patient verbalized understanding of stool results. Patient had EGD done by Dr. Vicente Males on 08/08/2019

## 2019-09-02 NOTE — Telephone Encounter (Signed)
-----   Message from Virgel Manifold, MD sent at 09/02/2019  1:08 PM EDT ----- Please let the patient know, her stool studies were negative.  I recommend an upper endoscopy due to her symptoms.

## 2019-09-12 ENCOUNTER — Ambulatory Visit: Payer: BC Managed Care – PPO | Admitting: Gastroenterology

## 2019-09-12 ENCOUNTER — Encounter: Payer: Self-pay | Admitting: Gastroenterology

## 2019-12-10 ENCOUNTER — Emergency Department
Admission: EM | Admit: 2019-12-10 | Discharge: 2019-12-10 | Disposition: A | Discharge status: Home / Self Care | Payer: BC Managed Care – PPO | Attending: Emergency Medicine | Admitting: Emergency Medicine

## 2019-12-10 ENCOUNTER — Other Ambulatory Visit: Payer: Self-pay

## 2019-12-10 DIAGNOSIS — Z79899 Other long term (current) drug therapy: Secondary | ICD-10-CM | POA: Insufficient documentation

## 2019-12-10 DIAGNOSIS — R112 Nausea with vomiting, unspecified: Secondary | ICD-10-CM | POA: Diagnosis present

## 2019-12-10 DIAGNOSIS — F1210 Cannabis abuse, uncomplicated: Secondary | ICD-10-CM | POA: Insufficient documentation

## 2019-12-10 DIAGNOSIS — F17210 Nicotine dependence, cigarettes, uncomplicated: Secondary | ICD-10-CM | POA: Diagnosis not present

## 2019-12-10 DIAGNOSIS — R1013 Epigastric pain: Secondary | ICD-10-CM | POA: Insufficient documentation

## 2019-12-10 LAB — COMPREHENSIVE METABOLIC PANEL
ALT: 16 U/L (ref 0–44)
AST: 16 U/L (ref 15–41)
Albumin: 4.6 g/dL (ref 3.5–5.0)
Alkaline Phosphatase: 80 U/L (ref 38–126)
Anion gap: 10 (ref 5–15)
BUN: 18 mg/dL (ref 6–20)
CO2: 21 mmol/L — ABNORMAL LOW (ref 22–32)
Calcium: 9.7 mg/dL (ref 8.9–10.3)
Chloride: 108 mmol/L (ref 98–111)
Creatinine, Ser: 0.82 mg/dL (ref 0.44–1.00)
GFR calc Af Amer: >60 mL/min (ref >60)
GFR calc non Af Amer: >60 mL/min (ref >60)
Glucose, Bld: 123 mg/dL — ABNORMAL HIGH (ref 70–99)
Potassium: 3.5 mmol/L (ref 3.5–5.1)
Sodium: 139 mmol/L (ref 135–145)
Total Bilirubin: 1.1 mg/dL (ref 0.3–1.2)
Total Protein: 8.1 g/dL (ref 6.5–8.1)

## 2019-12-10 LAB — CBC
HCT: 39.5 % (ref 36.0–46.0)
Hemoglobin: 13.4 g/dL (ref 12.0–15.0)
MCH: 29.8 pg (ref 26.0–34.0)
MCHC: 33.9 g/dL (ref 30.0–36.0)
MCV: 88 fL (ref 80.0–100.0)
Platelets: 285 10*3/uL (ref 150–400)
RBC: 4.49 MIL/uL (ref 3.87–5.11)
RDW: 14.1 % (ref 11.5–15.5)
WBC: 11.4 10*3/uL — ABNORMAL HIGH (ref 4.0–10.5)
nRBC: 0 % (ref 0.0–0.2)

## 2019-12-10 LAB — LIPASE, BLOOD: Lipase: 17 U/L (ref 11–51)

## 2019-12-10 LAB — HCG, QUANTITATIVE, PREGNANCY: hCG, Beta Chain, Quant, S: <1 m[IU]/mL (ref <5)

## 2019-12-10 MED ORDER — ONDANSETRON HCL 4 MG PO TABS: 4.0000 mg | ORAL_TABLET | Freq: Three times a day (TID) | ORAL | 0 refills | Status: AC | PRN

## 2019-12-10 MED ORDER — LIDOCAINE VISCOUS HCL 2 % MT SOLN
15.0000 mL | Freq: Once | OROMUCOSAL | Status: AC
  Administered 2019-12-10: 15 mL via OROMUCOSAL
  Filled 2019-12-10: qty 15

## 2019-12-10 MED ORDER — ONDANSETRON HCL 4 MG/2ML IJ SOLN
4.0000 mg | Freq: Once | INTRAMUSCULAR | Status: AC | PRN
  Administered 2019-12-10: 4 mg via INTRAVENOUS
  Filled 2019-12-10: qty 2

## 2019-12-10 MED ORDER — SODIUM CHLORIDE 0.9 % IV BOLUS
1000.0000 mL | Freq: Once | INTRAVENOUS | Status: AC
  Administered 2019-12-10: 1000 mL via INTRAVENOUS

## 2019-12-10 MED ORDER — DROPERIDOL 2.5 MG/ML IJ SOLN
2.5000 mg | Freq: Once | INTRAMUSCULAR | Status: AC
  Administered 2019-12-10: 2.5 mg via INTRAVENOUS
  Filled 2019-12-10: qty 2

## 2019-12-10 NOTE — ED Provider Notes (Signed)
[facility] Emergency Department Provider Note   ____________________________________________   I have reviewed the triage vital signs and the nursing notes.   HISTORY  Chief Complaint Emesis   History limited by: Not Limited   HPI Jodi Rogers is a 24 y.o. female who presents to the emergency department today because of concern for nausea, vomiting and abdominal pain. She states that she has been having episodes like this for years. Was told she has cyclic vomiting syndrome. This current episode started two weeks ago. She states it has gotten worse. Having multiple episodes of vomiting a day. The patient has had associated epigastric abdominal pain. States she has started vomiting blood as well. She has seen GI doctor in the past. Denies any fever. Smokes marijuana daily.   Records reviewed. Per medical record review patient has a history of ER visit in the past for concern for vomiting. Seen by GI. Asked to refrain from marijuana use.  Past Medical History:  Diagnosis Date  . GERD (gastroesophageal reflux disease)   . Medical history non-contributory   . Scoliosis     Patient Active Problem List   Diagnosis Date Noted  . Hematemesis with nausea   . Intractable nausea and vomiting 08/11/2016    Past Surgical History:  Procedure Laterality Date  . ESOPHAGOGASTRODUODENOSCOPY N/A 08/13/2016   Procedure: ESOPHAGOGASTRODUODENOSCOPY (EGD);  Surgeon: Manya Silvas, MD;  Location: [facility] ENDOSCOPY;  Service: Endoscopy;  Laterality: N/A;  . ESOPHAGOGASTRODUODENOSCOPY (EGD) WITH PROPOFOL N/A 08/08/2019   Procedure: ESOPHAGOGASTRODUODENOSCOPY (EGD) WITH PROPOFOL;  Surgeon: Jonathon Bellows, MD;  Location: [facility] ENDOSCOPY;  Service: Gastroenterology;  Laterality: N/A;    Prior to Admission medications   Medication Sig Start Date End Date Taking? Authorizing Provider  omeprazole (PRILOSEC) 20 MG capsule Take by mouth daily. 02/25/19   [provider]     Allergies Promethazine and Hydrocodone  Family History  Problem Relation Age of Onset  . Diabetes Neg Hx     Social History Social History   Tobacco Use  . Smoking status: Current Every Day Smoker    Packs/day: 0.50    Types: Cigarettes  . Smokeless tobacco: Former Systems developer    Quit date: 02/21/2018  . Tobacco comment: pt states she quit  Substance Use Topics  . Alcohol use: Yes    Comment: sometimes  . Drug use: Yes    Types: Marijuana    Review of Systems Constitutional: No fever/chills Eyes: No visual changes. ENT: No sore throat. Cardiovascular: Denies chest pain. Respiratory: Denies shortness of breath. Gastrointestinal: Positive for abdominal pain. Positive for nausea and vomiting.  Genitourinary: Negative for dysuria. Musculoskeletal: Negative for back pain. Skin: Negative for rash. Neurological: Negative for headaches, focal weakness or numbness.  ____________________________________________   PHYSICAL EXAM:  VITAL SIGNS: ED Triage Vitals  Enc Vitals Group     BP 12/10/19 0556 130/73     Pulse Rate 12/10/19 0556 72     Resp 12/10/19 0556 17     Temp 12/10/19 0556 98.1 F (36.7 C)     Temp Source 12/10/19 0556 Oral     SpO2 12/10/19 0556 99 %     Weight 12/10/19 0554 190 lb (86.2 kg)     Height 12/10/19 0554 5\' 10"  (1.778 m)   Constitutional: Alert and oriented.  Eyes: Conjunctivae are normal.  ENT      Head: Normocephalic and atraumatic.      Nose: No congestion/rhinnorhea.      Mouth/Throat: Mucous membranes are moist.  Neck: No stridor. Hematological/Lymphatic/Immunilogical: No cervical lymphadenopathy. Cardiovascular: Normal rate, regular rhythm.  No murmurs, rubs, or gallops.  Respiratory: Normal respiratory effort without tachypnea nor retractions. Breath sounds are clear and equal bilaterally. No wheezes/rales/rhonchi. Gastrointestinal: Soft and minimally tender to palpation in the epigastric region.  Genitourinary:  Deferred Musculoskeletal: Normal range of motion in all extremities. No lower extremity edema. Neurologic:  Normal speech and language. No gross focal neurologic deficits are appreciated.  Skin:  Skin is warm, dry and intact. No rash noted. Psychiatric: Mood and affect are normal. Speech and behavior are normal. Patient exhibits appropriate insight and judgment.  ____________________________________________    LABS (pertinent positives/negatives)  Lipase 17 CBC wbc 11.4, hgb 13.4, plt 285 bHCG <1 CMP wnl except co2 21, glu 123  ____________________________________________   EKG  None  ____________________________________________    RADIOLOGY  None  ____________________________________________   PROCEDURES  Procedures  ____________________________________________   INITIAL IMPRESSION / ASSESSMENT AND PLAN / ED COURSE  Pertinent labs & imaging results that were available during my care of the patient were reviewed by me and considered in my medical decision making (see chart for details).   Patient presented to the emergency department today because of concern for n/v/abd pain. Has history of daily marijuana use. Blood work without significant finding. Patient did feel better after droperidol and IVFs. Had a discussion with the patient about possible cannabinoid hyperemesis syndrome. Discussed it would be worth stopping marijuana use for a couple of months to see if that improved the frequency or symptoms she has been having. At this time do not feel any emergent imaging is necessary, doubt significant intraabdominal infection or perforation. Will give patient prescription for nausea medication.  ____________________________________________   FINAL CLINICAL IMPRESSION(S) / ED DIAGNOSES  Final diagnoses:  Nausea and vomiting, intractability of vomiting not specified, unspecified vomiting type     Note: This dictation was prepared with Dragon dictation. Any  transcriptional errors that result from this process are unintentional     Phineas Semen, MD 12/10/19 [phone]

## 2019-12-10 NOTE — ED Triage Notes (Signed)
Pt to the er for cyclic vomiting syndrome. Pt was dx by Dr Dario Guardian and given RX for anxiety meds but nothing for nausea. Pt states she cant keep anything down. No RX for suppositories given.

## 2019-12-10 NOTE — Discharge Instructions (Addendum)
As we discussed please refrain from using marijuana for a couple of months. It would be important to know if that helps decrease the frequency of your nausea and vomiting. If marijuana cessation (for a couple of months) does not change your symptoms or the frequency then investigation into other causes would have to begin. Please seek medical attention for any high fevers, chest pain, shortness of breath, change in behavior, persistent vomiting, bloody stool or any other new or concerning symptoms.

## 2019-12-10 NOTE — ED Notes (Signed)
Pt reports she smoke mariajuana daily.

## 2019-12-10 NOTE — ED Notes (Signed)
Medications administered per MD order. Pt noted have dry heaves after administration of viscous lidocaine. Pt repeatedly requesting ginger ale, this RN explained would wait several minutes after admin of nausea medications. Pt states understanding at this time. Pt given 2 more warm blankets, lights dimmed for patient comfort. Pt denies further needs.

## 2019-12-10 NOTE — ED Notes (Signed)
NAD noted at time of D/C. Pt denies questions or concerns. Pt ambulatory to the lobby at this time where her ride is waiting for her.

## 2019-12-12 MED FILL — COMPRO 25 MG SUPPOSITORY: 25 | 7 days supply | Qty: 12 | Fill #0

## 2019-12-17 ENCOUNTER — Other Ambulatory Visit: Payer: Self-pay

## 2019-12-17 ENCOUNTER — Ambulatory Visit: Payer: BC Managed Care – PPO | Admitting: Gastroenterology

## 2019-12-17 VITALS — BP 110/73 | HR 92 | Temp 98.0°F | Ht 70.0 in | Wt 187.4 lb

## 2019-12-17 DIAGNOSIS — F1290 Cannabis use, unspecified, uncomplicated: Secondary | ICD-10-CM | POA: Diagnosis not present

## 2019-12-17 DIAGNOSIS — R112 Nausea with vomiting, unspecified: Secondary | ICD-10-CM | POA: Diagnosis not present

## 2019-12-17 DIAGNOSIS — R197 Diarrhea, unspecified: Secondary | ICD-10-CM | POA: Diagnosis not present

## 2019-12-17 NOTE — Progress Notes (Signed)
Wyline Mood MD, MRCP(U.K) [address]  [address]  [place], [place] [address]  Main: [phone]  Fax: [phone]   Primary Care Physician: Sherrie Mustache, MD  Primary Gastroenterologist:  Dr. Wyline Mood   No chief complaint on file.   HPI: Jodi Rogers is a 24 y.o. female    Summary of history :  She is a patient of Dr. Maximino Greenland and was last seen in 08/04/2019 for nausea and vomiting with 1 episode of blood. At that time she also had left lower quadrant abdominal pain associated with her menstrual cycle.  She has been advised to abstain from marijuana.  Interval history   08/05/2019-12/17/2019  08/05/2019: Hemoglobin 12.8 g, CRP normal, fecal calprotectin normal, GI PCR and C. difficile testing were negative for infection.  08/08/2019: EGD: Normal  She presented to the emergency room on 12/10/2019 and 12/11/2019  With nausea vomiting and abdominal pain.  Longstanding history of the same and has been ongoing for years.  History of daily marijuana use.  Was discharged home.  On 12/11/2019 when she presented to [facility] With the same issue underwent an ultrasound of the abdomen right upper quadrant which showed no gallstones or biliary dilation.  Urine was positive for THC.  Lipase was elevated at 476.  Total bilirubin was completely normal. She has had her lipase checked on multiple occasions previously which has always been normal.  She says that she has continued to smoke marijuana on a regular basis , her partner at home smokes as well. She has an episode of effortless vomiting each morning and is fine afterwards, denies any new medications except abilify and no OTC meds. Is on Prilosec 40 mg a day . No family history of pancreatitis , no excess alcohol consumption. Has been having loose stools last 4 days, denies any abdominal pain. She used to get relief with hot showers in the beginning but does not do so presently .   Current Outpatient Medications  Medication Sig Dispense  Refill  . omeprazole (PRILOSEC) 20 MG capsule Take by mouth daily.    . ondansetron (ZOFRAN) 4 MG tablet Take 1 tablet (4 mg total) by mouth every 8 (eight) hours as needed. 20 tablet 0   No current facility-administered medications for this visit.    Allergies as of 12/17/2019 - Review Complete 12/10/2019  Allergen Reaction Noted  . Promethazine Nausea And Vomiting 08/02/2019  . Hydrocodone Nausea And Vomiting 05/13/2017    ROS:  General: Negative for anorexia, weight loss, fever, chills, fatigue, weakness. ENT: Negative for hoarseness, difficulty swallowing , nasal congestion. CV: Negative for chest pain, angina, palpitations, dyspnea on exertion, peripheral edema.  Respiratory: Negative for dyspnea at rest, dyspnea on exertion, cough, sputum, wheezing.  GI: See history of present illness. GU:  Negative for dysuria, hematuria, urinary incontinence, urinary frequency, nocturnal urination.  Endo: Negative for unusual weight change.    Physical Examination:   There were no vitals taken for this visit.  General: Well-nourished, well-developed in no acute distress.  Eyes: No icterus. Conjunctivae pink. Mouth: Oropharyngeal mucosa moist and pink , no lesions erythema or exudate. Lungs: Clear to auscultation bilaterally. Non-labored. Heart: Regular rate and rhythm, no murmurs rubs or gallops.  Abdomen: Bowel sounds are normal, nontender, nondistended, no hepatosplenomegaly or masses, no abdominal bruits or hernia , no rebound or guarding.   Neuro: Alert and oriented x 3.  Grossly intact. Skin: Warm and dry, no jaundice.   Psych: Alert and cooperative, normal mood and affect.  Imaging Studies: No results found.  Assessment and Plan:   Jodi Rogers is a 24 y.o. y/o female here to see me for vomiting, recent ER visits for the same , regular use of marijuana. Recently lipase was elevated but has been normal in the past. Normal usg of the liver and LFT's .  History is suggestive  of Cannaboid Hyperemesis syndrome. Lipase elevation is non specific and can be seen in pancreatitis but also in other conditions. Presently has no abdominal pain   Plan  1. Stop all marijuana use by self and other members under the same roof to reduce exposure from passive smoking 2. Due to throwing up - continue PPI till vomiting stops 3. She has provided lab work at Dr Matilde Bash office and will have it faxed to [place] when results are back  4. Explained if issue persists despite cessation of marijuana then would require further evaluation probably including CT abdomen with contrast .  5. She has been having loose stools last few days - will rule out infection    Dr Jonathon Bellows  MD,MRCP [facility]) Follow up in 8 weeks with Dr Bonna Gains when she is back to work .

## 2019-12-19 ENCOUNTER — Encounter: Payer: Self-pay | Admitting: Internal Medicine

## 2019-12-28 LAB — C DIFFICILE, CYTOTOXIN B

## 2019-12-28 LAB — C DIFFICILE TOXINS A+B W/RFLX: C difficile Toxins A+B, EIA: NEGATIVE

## 2019-12-30 ENCOUNTER — Encounter: Payer: Self-pay | Admitting: Gastroenterology

## 2020-01-01 LAB — GI PROFILE, STOOL, PCR

## 2020-01-01 LAB — CALPROTECTIN, FECAL: Calprotectin, Fecal: 43 ug/g (ref 0–120)

## 2023-09-13 ENCOUNTER — Encounter (HOSPITAL_COMMUNITY): Payer: Self-pay

## 2023-09-13 ENCOUNTER — Emergency Department (HOSPITAL_COMMUNITY)
Admission: EM | Admit: 2023-09-13 | Discharge: 2023-09-14 | Disposition: A | Discharge status: Home / Self Care | Payer: BC Managed Care – PPO | Attending: Emergency Medicine | Admitting: Emergency Medicine

## 2023-09-13 DIAGNOSIS — F17210 Nicotine dependence, cigarettes, uncomplicated: Secondary | ICD-10-CM | POA: Diagnosis not present

## 2023-09-13 DIAGNOSIS — R2241 Localized swelling, mass and lump, right lower limb: Secondary | ICD-10-CM | POA: Diagnosis present

## 2023-09-13 DIAGNOSIS — L03115 Cellulitis of right lower limb: Secondary | ICD-10-CM | POA: Insufficient documentation

## 2023-09-13 DIAGNOSIS — S80811A Abrasion, right lower leg, initial encounter: Secondary | ICD-10-CM | POA: Insufficient documentation

## 2023-09-13 DIAGNOSIS — L0390 Cellulitis, unspecified: Secondary | ICD-10-CM

## 2023-09-13 DIAGNOSIS — Y92410 Unspecified street and highway as the place of occurrence of the external cause: Secondary | ICD-10-CM | POA: Diagnosis not present
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
## 2023-09-14 ENCOUNTER — Emergency Department (HOSPITAL_COMMUNITY): Payer: BC Managed Care – PPO

## 2023-09-14 MED ORDER — NAPROXEN 250 MG PO TABS
500.0000 mg | ORAL_TABLET | Freq: Once | ORAL | Status: AC
  Administered 2023-09-14: 500 mg via ORAL
  Filled 2023-09-14: qty 2

## 2023-09-14 MED ORDER — OXYCODONE HCL 5 MG PO TABS
5.0000 mg | ORAL_TABLET | Freq: Once | ORAL | Status: AC
  Administered 2023-09-14: 5 mg via ORAL
  Filled 2023-09-14: qty 1

## 2023-09-14 MED ORDER — TETANUS-DIPHTH-ACELL PERTUSSIS 5-2.5-18.5 LF-MCG/0.5 IM SUSY: 0.5000 mL | PREFILLED_SYRINGE | Freq: Once | INTRAMUSCULAR | Status: DC

## 2023-09-14 MED ORDER — NAPROXEN 500 MG PO TABS: 500.0000 mg | ORAL_TABLET | Freq: Two times a day (BID) | ORAL | 0 refills | Status: AC

## 2023-09-14 MED ORDER — AMOXICILLIN-POT CLAVULANATE 875-125 MG PO TABS: 1.0000 | ORAL_TABLET | Freq: Two times a day (BID) | ORAL | 0 refills | Status: AC

## 2023-09-14 MED ORDER — AMOXICILLIN-POT CLAVULANATE 875-125 MG PO TABS
1.0000 | ORAL_TABLET | Freq: Once | ORAL | Status: AC
  Administered 2023-09-14: 1 via ORAL
  Filled 2023-09-14: qty 1
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
## 2023-09-14 NOTE — Discharge Instructions (Signed)
You were evaluated in the Emergency Department and after careful evaluation, we did not find any emergent condition requiring admission or further testing in the hospital.  Your exam/testing today is overall reassuring.  X-rays did not show any significant injuries.  Take the Augmentin antibiotic as directed.  Use the Naprosyn twice daily for pain.  Please return to the Emergency Department if you experience any worsening of your condition.   Thank you for allowing [place] to be a part of your care.

## 2023-11-06 ENCOUNTER — Ambulatory Visit: Payer: BC Managed Care – PPO | Admitting: Physician Assistant
